# Patient Record
Sex: Female | Born: 1990
Health system: Southern US, Community
[De-identification: ages and names within clinical notes are randomized; demographics above are authoritative.]

## PROBLEM LIST (undated history)

## (undated) DIAGNOSIS — M329 Systemic lupus erythematosus, unspecified: Secondary | ICD-10-CM

## (undated) DIAGNOSIS — D84821 Immunodeficiency due to drugs: Secondary | ICD-10-CM

## (undated) DIAGNOSIS — R0609 Other forms of dyspnea: Secondary | ICD-10-CM

## (undated) DIAGNOSIS — J302 Other seasonal allergic rhinitis: Secondary | ICD-10-CM

## (undated) DIAGNOSIS — F32A Depression, unspecified: Secondary | ICD-10-CM

## (undated) DIAGNOSIS — I493 Ventricular premature depolarization: Secondary | ICD-10-CM

## (undated) DIAGNOSIS — F329 Major depressive disorder, single episode, unspecified: Secondary | ICD-10-CM

## (undated) HISTORY — DX: Immunodeficiency due to drugs: D84.821

## (undated) HISTORY — DX: Systemic lupus erythematosus, unspecified: M32.9

## (undated) HISTORY — DX: Major depressive disorder, single episode, unspecified: F32.9

## (undated) HISTORY — PX: OTHER SURGICAL HISTORY: SHX169

## (undated) HISTORY — DX: Ventricular premature depolarization: I49.3

## (undated) HISTORY — DX: Other forms of dyspnea: R06.09

## (undated) HISTORY — DX: Depression, unspecified: F32.A

## (undated) HISTORY — DX: Other seasonal allergic rhinitis: J30.2

---
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
## 2011-04-30 ENCOUNTER — Other Ambulatory Visit (HOSPITAL_COMMUNITY): Payer: Self-pay | Admitting: Family Medicine

## 2011-04-30 ENCOUNTER — Ambulatory Visit (HOSPITAL_COMMUNITY)
Admission: RE | Admit: 2011-04-30 | Discharge: 2011-04-30 | Disposition: A | Discharge status: Home / Self Care | Payer: BC Managed Care – PPO | Source: Ambulatory Visit | Attending: Family Medicine | Admitting: Family Medicine

## 2011-04-30 DIAGNOSIS — M25579 Pain in unspecified ankle and joints of unspecified foot: Secondary | ICD-10-CM

## 2011-04-30 DIAGNOSIS — S91309A Unspecified open wound, unspecified foot, initial encounter: Secondary | ICD-10-CM | POA: Insufficient documentation

## 2011-04-30 DIAGNOSIS — W268XXA Contact with other sharp object(s), not elsewhere classified, initial encounter: Secondary | ICD-10-CM | POA: Insufficient documentation

## 2011-08-06 LAB — RAPID URINE DRUG SCREEN, HOSP PERFORMED
Barbiturates: NOT DETECTED
Benzodiazepines: NOT DETECTED
Cocaine: NOT DETECTED
Tetrahydrocannabinol: NOT DETECTED

## 2011-08-06 LAB — URINE MICROSCOPIC-ADD ON

## 2011-08-06 LAB — POCT I-STAT CREATININE: Operator id: 196461

## 2011-08-06 LAB — I-STAT 8, (EC8 V) (CONVERTED LAB)
Acid-base deficit: 2
BUN: 12
Bicarbonate: 22.1
Chloride: 108
Glucose, Bld: 89
HCT: 42
Potassium: 3.9
TCO2: 23

## 2011-08-06 LAB — CBC
MCHC: 33.8
MCV: 97.6 — ABNORMAL HIGH
RBC: 4.1
RDW: 12.3

## 2011-08-06 LAB — CARBOXYHEMOGLOBIN
Carboxyhemoglobin: 1.2
Methemoglobin: 1.3
Total hemoglobin: 13.5

## 2011-08-06 LAB — DIFFERENTIAL
Eosinophils Absolute: 0.2
Monocytes Absolute: 0.7
Monocytes Relative: 7
Neutro Abs: 7.9
Neutrophils Relative %: 78 — ABNORMAL HIGH

## 2011-08-06 LAB — URINALYSIS, ROUTINE W REFLEX MICROSCOPIC
Bilirubin Urine: NEGATIVE
Nitrite: NEGATIVE
Protein, ur: 30 — AB
Urobilinogen, UA: 0.2

## 2014-08-01 ENCOUNTER — Institutional Professional Consult (permissible substitution): Payer: BC Managed Care – PPO | Admitting: Internal Medicine

## 2015-04-11 ENCOUNTER — Other Ambulatory Visit (HOSPITAL_COMMUNITY)
Admission: RE | Admit: 2015-04-11 | Discharge: 2015-04-11 | Disposition: A | Discharge status: Home / Self Care | Payer: Self-pay | Source: Ambulatory Visit | Attending: Gynecology | Admitting: Gynecology

## 2015-04-11 ENCOUNTER — Ambulatory Visit (INDEPENDENT_AMBULATORY_CARE_PROVIDER_SITE_OTHER): Payer: BLUE CROSS/BLUE SHIELD | Admitting: Women's Health

## 2015-04-11 ENCOUNTER — Encounter: Payer: Self-pay | Admitting: Women's Health

## 2015-04-11 VITALS — BP 112/66 | Ht 66.0 in | Wt 147.0 lb

## 2015-04-11 DIAGNOSIS — Z01419 Encounter for gynecological examination (general) (routine) without abnormal findings: Secondary | ICD-10-CM | POA: Diagnosis not present

## 2015-04-11 DIAGNOSIS — N3000 Acute cystitis without hematuria: Secondary | ICD-10-CM

## 2015-04-11 DIAGNOSIS — Z113 Encounter for screening for infections with a predominantly sexual mode of transmission: Secondary | ICD-10-CM | POA: Diagnosis not present

## 2015-04-11 DIAGNOSIS — Z3041 Encounter for surveillance of contraceptive pills: Secondary | ICD-10-CM | POA: Diagnosis not present

## 2015-04-11 LAB — CBC WITH DIFFERENTIAL/PLATELET
BASOS ABS: 0 10*3/uL (ref 0.0–0.1)
BASOS PCT: 0 % (ref 0–1)
Eosinophils Absolute: 0 10*3/uL (ref 0.0–0.7)
Eosinophils Relative: 0 % (ref 0–5)
HCT: 38.4 % (ref 36.0–46.0)
Hemoglobin: 12.7 g/dL (ref 12.0–15.0)
Lymphocytes Relative: 19 % (ref 12–46)
Lymphs Abs: 1.1 10*3/uL (ref 0.7–4.0)
MCH: 31 pg (ref 26.0–34.0)
MCHC: 33.1 g/dL (ref 30.0–36.0)
MCV: 93.7 fL (ref 78.0–100.0)
MONOS PCT: 10 % (ref 3–12)
MPV: 9.1 fL (ref 8.6–12.4)
Monocytes Absolute: 0.6 10*3/uL (ref 0.1–1.0)
NEUTROS ABS: 4.1 10*3/uL (ref 1.7–7.7)
NEUTROS PCT: 71 % (ref 43–77)
PLATELETS: 154 10*3/uL (ref 150–400)
RBC: 4.1 MIL/uL (ref 3.87–5.11)
RDW: 13.6 % (ref 11.5–15.5)
WBC: 5.8 10*3/uL (ref 4.0–10.5)

## 2015-04-11 LAB — HEPATITIS B SURFACE ANTIGEN: Hepatitis B Surface Ag: NEGATIVE

## 2015-04-11 LAB — URINALYSIS W MICROSCOPIC + REFLEX CULTURE
BILIRUBIN URINE: NEGATIVE
CASTS: NONE SEEN
Crystals: NONE SEEN
GLUCOSE, UA: 100 mg/dL — AB
Ketones, ur: NEGATIVE mg/dL
Nitrite: POSITIVE — AB
PH: 6 (ref 5.0–8.0)
PROTEIN: NEGATIVE mg/dL
Specific Gravity, Urine: <1.005 — ABNORMAL LOW (ref 1.005–1.030)
Urobilinogen, UA: 1 mg/dL (ref 0.0–1.0)

## 2015-04-11 LAB — TSH: TSH: 1.613 u[IU]/mL (ref 0.350–4.500)

## 2015-04-11 LAB — HEPATITIS C ANTIBODY: HCV AB: NEGATIVE

## 2015-04-11 LAB — RPR

## 2015-04-11 MED ORDER — SULFAMETHOXAZOLE-TRIMETHOPRIM 800-160 MG PO TABS: 1.0000 | ORAL_TABLET | Freq: Two times a day (BID) | ORAL | Status: DC

## 2015-04-11 MED ORDER — NORGESTIM-ETH ESTRAD TRIPHASIC 0.18/0.215/0.25 MG-35 MCG PO TABS: 1.0000 | ORAL_TABLET | Freq: Every day | ORAL | Status: DC

## 2015-04-11 NOTE — Progress Notes (Signed)
Leslie Mccoy [birth year] [id number]    History:    Presents for annual exam.  Regular light monthly cycle on Ortho Tri-Cyclen originally prescribed by primary care. Has never had pelvic exam. Also having increased urinary frequency with urgency. Maternal aunt breast cancer. Same partner. Gardasil series completed in high school.  Past medical history, past surgical history, family history and social history were all reviewed and documented in the EPIC chart. 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 Parents healthy.  ROS:  A ROS was performed and pertinent positives and negatives are included.  Exam:  Filed Vitals:   04/11/15 1008  BP: 112/66    General appearance:  Normal Thyroid:  Symmetrical, normal in size, without palpable masses or nodularity. Respiratory  Auscultation:  Clear without wheezing or rhonchi Cardiovascular  Auscultation:  Regular rate, without rubs, murmurs or gallops  Edema/varicosities:  Not grossly evident Abdominal  Soft,nontender, without masses, guarding or rebound.  Liver/spleen:  No organomegaly noted  Hernia:  None appreciated  Skin  Inspection:  Grossly normal   Breasts: Examined lying and sitting.     Right: Without masses, retractions, discharge or axillary adenopathy.     Left: Without masses, retractions, discharge or axillary adenopathy. Gentitourinary   Inguinal/mons:  Normal without inguinal adenopathy  External genitalia:  Normal  BUS/Urethra/Skene's glands:  Normal  Vagina:  Normal  Cervix:  Normal  Uterus:   normal in size, shape and contour.  Midline and mobile  Adnexa/parametria:     Rt: Without masses or tenderness.   Lt: Without masses or tenderness.  Anus and perineum: Normal   UA: Moderate blood,  small leukocytes,  TNTC WBCs, many bacteria. Assessment/Plan:  24 y.o. S W[employer] G0  for annual exam and with UTI symptoms.    UTI Contraception management STD screen Anxiety/depression  managed by primary care  Plan: Septra twice daily for 3 days #6. Instructed to call if no relief of symptoms. UTI prevention discussed. Ortho Tri-Cyclen prescription, proper use, slight risk for blood clots and strokes reviewed, continue condoms until permanent partner. SBE's, exercise, calcium rich diet, MVI daily encouraged. Safety reviewed. CBC, UA, Pap, GC/Chlamydia, HIV, hep B, C, RPR. Urine culture pending.   Harrington Challenger WHNP, 11:18 AM 04/11/2015

## 2015-04-11 NOTE — Patient Instructions (Signed)

## 2015-04-12 LAB — HIV ANTIBODY (ROUTINE TESTING W REFLEX): HIV 1&2 Ab, 4th Generation: NONREACTIVE

## 2015-04-12 LAB — GC/CHLAMYDIA PROBE AMP
CT Probe RNA: NEGATIVE
GC Probe RNA: NEGATIVE

## 2015-04-13 ENCOUNTER — Ambulatory Visit: Payer: Self-pay | Admitting: Women's Health

## 2015-04-13 LAB — URINE CULTURE
COLONY COUNT: NO GROWTH
Organism ID, Bacteria: NO GROWTH

## 2015-04-13 LAB — CYTOLOGY - PAP

## 2015-04-14 ENCOUNTER — Telehealth: Payer: Self-pay | Admitting: *Deleted

## 2015-04-14 NOTE — Telephone Encounter (Signed)
Please call and review urine culture was negative, best to take over-the-counter Azo, cranberry supplement, and increase plain water. Call if symptoms persist.

## 2015-04-14 NOTE — Telephone Encounter (Signed)
Pt informed with the below note. 

## 2015-04-14 NOTE — Telephone Encounter (Signed)
Pt was prescribed Septra twice daily for 3 days #6 on 04/11/15. Took last pill last night and woke up this am with urgency still. Please advise

## 2015-04-20 ENCOUNTER — Telehealth: Payer: Self-pay | Admitting: *Deleted

## 2015-04-20 NOTE — Telephone Encounter (Signed)
Pt informed with negative STD screening results.

## 2015-04-21 ENCOUNTER — Telehealth: Payer: Self-pay | Admitting: *Deleted

## 2015-04-21 NOTE — Telephone Encounter (Signed)
Pt will continue pills, taking new pack of pills already, in active. Will follow up if needed

## 2015-04-21 NOTE — Telephone Encounter (Signed)
Pt called c/o bleeding in first pack of birth control pills, changing pads every 5 hours, started new pack this past Sunday. Pt said she has been bleeding now for 1 week. Recommendations? Please advise

## 2015-04-21 NOTE — Telephone Encounter (Signed)
Please call, instruct to start new pack today, review common to have some irregular bleeding first months on pills. Call if continued problems with cycle.

## 2015-11-21 ENCOUNTER — Telehealth: Payer: Self-pay | Admitting: *Deleted

## 2015-11-21 NOTE — Telephone Encounter (Signed)
Pt called and left message in triage c/o headaches x 1 month, I called pt back and asked her to return my call.

## 2016-01-10 ENCOUNTER — Ambulatory Visit (INDEPENDENT_AMBULATORY_CARE_PROVIDER_SITE_OTHER): Payer: BLUE CROSS/BLUE SHIELD | Admitting: Women's Health

## 2016-01-10 ENCOUNTER — Telehealth: Payer: Self-pay | Admitting: *Deleted

## 2016-01-10 ENCOUNTER — Encounter: Payer: Self-pay | Admitting: Women's Health

## 2016-01-10 VITALS — BP 112/72

## 2016-01-10 DIAGNOSIS — M069 Rheumatoid arthritis, unspecified: Secondary | ICD-10-CM | POA: Diagnosis not present

## 2016-01-10 LAB — RHEUMATOID FACTOR: Rhuematoid fact SerPl-aCnc: <10 IU/mL (ref <=14)

## 2016-01-10 MED ORDER — IBUPROFEN 600 MG PO TABS: 600.0000 mg | ORAL_TABLET | Freq: Three times a day (TID) | ORAL | Status: DC | PRN

## 2016-01-10 NOTE — Telephone Encounter (Signed)
Per nancy schedule appointment for pt to see rheumatology for joint pain, referral faxed to [facility] rheumatology they will contact pt to schedule and call me with time and date. I informed pt with this as well.

## 2016-01-10 NOTE — Progress Notes (Signed)
Presents with 2-3 weeks of joint stiffness, swelling, and reduced functionality in the joints of her right hand. She reports symptoms are worse when waking up, often taking up to 4 hours to fully resolve. Interfering with ADLs. No swelling/stiffness of left hand. Stiffness to right shoulder, left hip and right elbow as well. Denies rashes, fever, fatigue, change in routine, injury, tic bites or viruses. Monthly cycle on Ortho Tri-Cyclen/same partner.   Exam: Appears well. Mild edema visible over the second through fourth metacarpals of the right hand, no erythema or warmth, with decreased range of motion. Left hand normal with full range of motion. 3/5 strength to right hand, 5/5 strength to left hand. Right elbow pain with extension. Right shoulder pain with external rotation. Full range of motion to left elbow and left shoulder without pain.  Joint stiffness/swelling  Plan: Rx for Motrin 600 mg q8 hours for pain given, advised using alternating heating and icing over affected joints. ESR, rheumatoid factor, anti-CCP IgG. Referral to rheumatology.

## 2016-01-10 NOTE — Patient Instructions (Signed)
Rheumatoid Arthritis  Rheumatoid arthritis is a long-term (chronic) inflammatory disease that causes pain, swelling, and stiffness of the joints. It can affect the entire body, including the eyes and lungs. The effects of rheumatoid arthritis vary widely among those with the condition.  CAUSES  The cause of rheumatoid arthritis is not known. It tends to run in families and is more common in women. Certain cells of the body's natural defense system (immune system) do not work properly and begin to attack healthy joints. It primarily involves the connective tissue that lines the joints (synovial membrane). This can cause damage to the joint.  SYMPTOMS  · Pain, stiffness, swelling, and decreased motion of many joints, especially in the hands and feet.  · Stiffness that is worse in the morning. It may last 1-2 hours or longer.  · Numbness and tingling in the hands.  · Fatigue.  · Loss of appetite.  · Weight loss.  · Low-grade fever.  · Dry eyes and mouth.  · Firm lumps (rheumatoid nodules) that grow beneath the skin in areas such as the elbows and hands.  DIAGNOSIS  Diagnosis is based on the symptoms described, an exam, and blood tests. Sometimes, X-rays are helpful.  TREATMENT  The goals of treatment are to relieve pain, reduce inflammation, and to slow down or stop joint damage and disability. Methods vary and may include:  · Maintaining a balance of rest, exercise, and proper nutrition.  · Your health care provider may adjust your medicines every 3 months until treatment goals are reached. Common medicines include:    Pain relievers (analgesics).    Corticosteroids and nonsteroidal anti-inflammatory drugs (NSAIDs) to reduce inflammation.    Disease-modifying antirheumatic drugs (DMARDs) to try to slow the course of the disease.    Biologic response modifiers to reduce inflammation and damage.  · Physical therapy and occupational therapy.  · Surgery for patients with severe joint damage. Joint replacement or fusing of  joints may be needed.  · Routine monitoring and ongoing care, such as office visits, blood and urine tests, and X-rays.  Your health care provider will work with you to identify the best treatment option for you, based on an assessment of the overall disease activity in your body.  HOME CARE INSTRUCTIONS  · Remain physically active and reduce activity when the disease gets worse.  · Eat a well-balanced diet.  · Put heat on affected joints when you wake up and before activities. Keep the heat on the affected joint for as long as directed by your health care provider.  · Put ice on affected joints following activities or exercising.    Put ice in a plastic bag.    Place a towel between your skin and the bag.    Leave the ice on for 15-20 minutes, 3-4 times per day, or as directed by your health care provider.  · Take medicines and supplements only as directed by your health care provider.  · Use splints as directed by your health care provider. Splints help maintain joint position and function.  · Do not sleep with pillows under your knees. This may lead to spasms.  · Participate in a self-management program to keep current with the latest treatment and coping skills.  SEEK IMMEDIATE MEDICAL CARE IF:  · You have fainting episodes.  · You have periods of extreme weakness.  · You rapidly develop a hot, painful joint that is more severe than usual joint aches.  · You have chills.  ·   You have a fever.  FOR MORE INFORMATION  · American College of Rheumatology: www.rheumatology.org  · Arthritis Foundation: www.arthritis.org     This information is not intended to replace advice given to you by your health care provider. Make sure you discuss any questions you have with your health care provider.     Document Released: 10/11/2000 Document Revised: 11/04/2014 Document Reviewed: 11/20/2011  Elsevier Interactive Patient Education ©2016 [employer]

## 2016-01-11 LAB — SEDIMENTATION RATE: SED RATE: 44 mm/h — AB (ref 0–20)

## 2016-01-11 LAB — CYCLIC CITRUL PEPTIDE ANTIBODY, IGG

## 2016-01-16 NOTE — Telephone Encounter (Signed)
Appointment on 01/24/16 @ 8:15am with Azucena Fallen, PAC/ Dr.Hawkins PA

## 2016-01-27 DIAGNOSIS — M329 Systemic lupus erythematosus, unspecified: Secondary | ICD-10-CM

## 2016-01-27 DIAGNOSIS — IMO0002 Reserved for concepts with insufficient information to code with codable children: Secondary | ICD-10-CM

## 2016-01-27 HISTORY — DX: Reserved for concepts with insufficient information to code with codable children: IMO0002

## 2016-01-27 HISTORY — DX: Systemic lupus erythematosus, unspecified: M32.9

## 2016-02-06 DIAGNOSIS — M2550 Pain in unspecified joint: Secondary | ICD-10-CM | POA: Diagnosis not present

## 2016-02-06 DIAGNOSIS — Z6822 Body mass index (BMI) 22.0-22.9, adult: Secondary | ICD-10-CM | POA: Diagnosis not present

## 2016-02-06 DIAGNOSIS — Z1389 Encounter for screening for other disorder: Secondary | ICD-10-CM | POA: Diagnosis not present

## 2016-02-06 DIAGNOSIS — Z0000 Encounter for general adult medical examination without abnormal findings: Secondary | ICD-10-CM | POA: Diagnosis not present

## 2016-02-07 DIAGNOSIS — M7989 Other specified soft tissue disorders: Secondary | ICD-10-CM | POA: Diagnosis not present

## 2016-02-07 DIAGNOSIS — M2550 Pain in unspecified joint: Secondary | ICD-10-CM | POA: Diagnosis not present

## 2016-02-07 DIAGNOSIS — M329 Systemic lupus erythematosus, unspecified: Secondary | ICD-10-CM | POA: Diagnosis not present

## 2016-02-07 DIAGNOSIS — R5383 Other fatigue: Secondary | ICD-10-CM | POA: Diagnosis not present

## 2016-02-20 ENCOUNTER — Encounter: Payer: Self-pay | Admitting: Women's Health

## 2016-02-20 DIAGNOSIS — M329 Systemic lupus erythematosus, unspecified: Secondary | ICD-10-CM | POA: Insufficient documentation

## 2016-02-20 DIAGNOSIS — IMO0002 Reserved for concepts with insufficient information to code with codable children: Secondary | ICD-10-CM | POA: Insufficient documentation

## 2016-03-05 DIAGNOSIS — F4323 Adjustment disorder with mixed anxiety and depressed mood: Secondary | ICD-10-CM | POA: Diagnosis not present

## 2016-03-14 DIAGNOSIS — M329 Systemic lupus erythematosus, unspecified: Secondary | ICD-10-CM | POA: Diagnosis not present

## 2016-03-14 DIAGNOSIS — R5383 Other fatigue: Secondary | ICD-10-CM | POA: Diagnosis not present

## 2016-03-14 DIAGNOSIS — M2550 Pain in unspecified joint: Secondary | ICD-10-CM | POA: Diagnosis not present

## 2016-03-14 DIAGNOSIS — Z79899 Other long term (current) drug therapy: Secondary | ICD-10-CM | POA: Diagnosis not present

## 2016-04-26 DIAGNOSIS — Z3041 Encounter for surveillance of contraceptive pills: Secondary | ICD-10-CM | POA: Diagnosis not present

## 2016-04-26 DIAGNOSIS — M3219 Other organ or system involvement in systemic lupus erythematosus: Secondary | ICD-10-CM | POA: Diagnosis not present

## 2016-05-04 ENCOUNTER — Other Ambulatory Visit: Payer: Self-pay | Admitting: Women's Health

## 2016-05-05 ENCOUNTER — Other Ambulatory Visit: Payer: Self-pay | Admitting: Women's Health

## 2016-05-06 ENCOUNTER — Other Ambulatory Visit: Payer: Self-pay | Admitting: Women's Health

## 2016-05-06 NOTE — Telephone Encounter (Signed)
Annual scheduled on 06/04/16 

## 2016-05-28 HISTORY — PX: INTRAUTERINE DEVICE INSERTION: SHX323

## 2016-05-29 ENCOUNTER — Encounter: Payer: Self-pay | Admitting: Women's Health

## 2016-05-29 ENCOUNTER — Telehealth: Payer: Self-pay | Admitting: Gynecology

## 2016-05-29 ENCOUNTER — Ambulatory Visit (INDEPENDENT_AMBULATORY_CARE_PROVIDER_SITE_OTHER): Payer: BLUE CROSS/BLUE SHIELD | Admitting: Women's Health

## 2016-05-29 VITALS — BP 115/76 | Ht 66.0 in | Wt 138.4 lb

## 2016-05-29 DIAGNOSIS — Z3041 Encounter for surveillance of contraceptive pills: Secondary | ICD-10-CM

## 2016-05-29 DIAGNOSIS — Z01419 Encounter for gynecological examination (general) (routine) without abnormal findings: Secondary | ICD-10-CM

## 2016-05-29 MED ORDER — NORGESTIM-ETH ESTRAD TRIPHASIC 0.18/0.215/0.25 MG-35 MCG PO TABS: 1.0000 | ORAL_TABLET | Freq: Every day | ORAL | 4 refills | Status: DC

## 2016-05-29 NOTE — Progress Notes (Signed)
Leslie Mccoy 12-01-1990 [id number]    History:    Presents for annual exam.  Monthly cycle on TriNessa. Diagnosed with lupus April 2017. Symptoms started out with arthritic type symptoms in her hands. Is seeing a doctor at [facility] in the lupus clinic, currently on methotrexate, Plaquenil and is tapering prednisone.. Gardasil series completed. Negative STD screen with current partner. Normal Pap history.  Past medical history, past surgical history, family history and social history were all reviewed and documented in the EPIC chart. Graduated from [employer] currently not working. Parents healthy.  ROS:  A ROS was performed and pertinent positives and negatives are included.  Exam:  Vitals:   05/29/16 1052  BP: 115/76     General appearance:  Normal Thyroid:  Symmetrical, normal in size, without palpable masses or nodularity. Respiratory  Auscultation:  Clear without wheezing or rhonchi Cardiovascular  Auscultation:  Regular rate, without rubs, murmurs or gallops  Edema/varicosities:  Not grossly evident Abdominal  Soft,nontender, without masses, guarding or rebound.  Liver/spleen:  No organomegaly noted  Hernia:  None appreciated  Skin  Inspection:  Grossly normal   Breasts: Examined lying and sitting.     Right: Without masses, retractions, discharge or axillary adenopathy.     Left: Without masses, retractions, discharge or axillary adenopathy. Gentitourinary   Inguinal/mons:  Normal without inguinal adenopathy  External genitalia:  Normal  BUS/Urethra/Skene's glands:  Normal  Vagina:  Normal  Cervix:  Normal  Uterus: normal in size, shape and contour.  Midline and mobile  Adnexa/parametria:     Rt: Without masses or tenderness.   Lt: Without masses or tenderness.  Anus and perineum: Normal    Assessment/Plan:  25 y.o. engaged [employer] G0 for annual exam.     Contraception management 01/2016-lupus diagnosed  Plan: Contraception options reviewed, currently on TriNessa, options  reviewed, Mirena IUD reviewed slight risk for infection, perforation and hemorrhage. Reviewed would be a safer option while on medications for lupus. Will check coverage have Dr. Audie Box place with next cycle. SBE's, exercise, yoga, importance of rest and exercise reviewed. UA, Pap normal 2016, new screening guidelines reviewed.    Harrington Challenger [facility], 11:32 AM 05/29/2016

## 2016-05-29 NOTE — Patient Instructions (Addendum)
Levonorgestrel intrauterine device (IUD) What is this medicine? LEVONORGESTREL IUD (LEE voe nor jes trel) is a contraceptive (birth control) device. The device is placed inside the uterus by a healthcare professional. It is used to prevent pregnancy and can also be used to treat heavy bleeding that occurs during your period. Depending on the device, it can be used for 3 to 5 years. This medicine may be used for other purposes; ask your health care provider or pharmacist if you have questions. What should I tell my health care provider before I take this medicine? They need to know if you have any of these conditions: -abnormal Pap smear -cancer of the breast, uterus, or cervix -diabetes -endometritis -genital or pelvic infection now or in the past -have more than one sexual partner or your partner has more than one partner -heart disease -history of an ectopic or tubal pregnancy -immune system problems -IUD in place -liver disease or tumor -problems with blood clots or take blood-thinners -use intravenous drugs -uterus of unusual shape -vaginal bleeding that has not been explained -an unusual or allergic reaction to levonorgestrel, other hormones, silicone, or polyethylene, medicines, foods, dyes, or preservatives -pregnant or trying to get pregnant -breast-feeding How should I use this medicine? This device is placed inside the uterus by a health care professional. Talk to your pediatrician regarding the use of this medicine in children. Special care may be needed. Overdosage: If you think you have taken too much of this medicine contact a poison control center or emergency room at once. NOTE: This medicine is only for you. Do not share this medicine with others. What if I miss a dose? This does not apply. What may interact with this medicine? Do not take this medicine with any of the following medications: -amprenavir -bosentan -fosamprenavir This medicine may also interact with  the following medications: -aprepitant -barbiturate medicines for inducing sleep or treating seizures -bexarotene -griseofulvin -medicines to treat seizures like carbamazepine, ethotoin, felbamate, oxcarbazepine, phenytoin, topiramate -modafinil -pioglitazone -rifabutin -rifampin -rifapentine -some medicines to treat HIV infection like atazanavir, indinavir, lopinavir, nelfinavir, tipranavir, ritonavir -St. John's wort -warfarin This list may not describe all possible interactions. Give your health care provider a list of all the medicines, herbs, non-prescription drugs, or dietary supplements you use. Also tell them if you smoke, drink alcohol, or use illegal drugs. Some items may interact with your medicine. What should I watch for while using this medicine? Visit your doctor or health care professional for regular check ups. See your doctor if you or your partner has sexual contact with others, becomes HIV positive, or gets a sexual transmitted disease. This product does not protect you against HIV infection (AIDS) or other sexually transmitted diseases. You can check the placement of the IUD yourself by reaching up to the top of your vagina with clean fingers to feel the threads. Do not pull on the threads. It is a good habit to check placement after each menstrual period. Call your doctor right away if you feel more of the IUD than just the threads or if you cannot feel the threads at all. The IUD may come out by itself. You may become pregnant if the device comes out. If you notice that the IUD has come out use a backup birth control method like condoms and call your health care provider. Using tampons will not change the position of the IUD and are okay to use during your period. What side effects may I notice from receiving this medicine?  Side effects that you should report to your doctor or health care professional as soon as possible: -allergic reactions like skin rash, itching or  hives, swelling of the face, lips, or tongue -fever, flu-like symptoms -genital sores -high blood pressure -no menstrual period for 6 weeks during use -pain, swelling, warmth in the leg -pelvic pain or tenderness -severe or sudden headache -signs of pregnancy -stomach cramping -sudden shortness of breath -trouble with balance, talking, or walking -unusual vaginal bleeding, discharge -yellowing of the eyes or skin Side effects that usually do not require medical attention (report to your doctor or health care professional if they continue or are bothersome): -acne -breast pain -change in sex drive or performance -changes in weight -cramping, dizziness, or faintness while the device is being inserted -headache -irregular menstrual bleeding within first 3 to 6 months of use -nausea This list may not describe all possible side effects. Call your doctor for medical advice about side effects. You may report side effects to FDA at 1-800-FDA-1088. Where should I keep my medicine? This does not apply. NOTE: This sheet is a summary. It may not cover all possible information. If you have questions about this medicine, talk to your doctor, pharmacist, or health care provider.    2016, Elsevier/Gold Standard. (2011-11-14 13:54:04) Health Maintenance, Female Adopting a healthy lifestyle and getting preventive care can go a long way to promote health and wellness. Talk with your health care provider about what schedule of regular examinations is right for you. This is a good chance for you to check in with your provider about disease prevention and staying healthy. In between checkups, there are plenty of things you can do on your own. Experts have done a lot of research about which lifestyle changes and preventive measures are most likely to keep you healthy. Ask your health care provider for more information. WEIGHT AND DIET  Eat a healthy diet  Be sure to include plenty of vegetables, fruits,  low-fat dairy products, and lean protein.  Do not eat a lot of foods high in solid fats, added sugars, or salt.  Get regular exercise. This is one of the most important things you can do for your health.  Most adults should exercise for at least 150 minutes each week. The exercise should increase your heart rate and make you sweat (moderate-intensity exercise).  Most adults should also do strengthening exercises at least twice a week. This is in addition to the moderate-intensity exercise.  Maintain a healthy weight  Body mass index (BMI) is a measurement that can be used to identify possible weight problems. It estimates body fat based on height and weight. Your health care provider can help determine your BMI and help you achieve or maintain a healthy weight.  For females 30 years of age and older:   A BMI below 18.5 is considered underweight.  A BMI of 18.5 to 24.9 is normal.  A BMI of 25 to 29.9 is considered overweight.  A BMI of 30 and above is considered obese.  Watch levels of cholesterol and blood lipids  You should start having your blood tested for lipids and cholesterol at 24 years of age, then have this test every 5 years.  You may need to have your cholesterol levels checked more often if:  Your lipid or cholesterol levels are high.  You are older than 24 years of age.  You are at high risk for heart disease.  CANCER SCREENING   Lung Cancer  Lung cancer  screening is recommended for adults 58-66 years old who are at high risk for lung cancer because of a history of smoking.  A yearly low-dose CT scan of the lungs is recommended for people who:  Currently smoke.  Have quit within the past 15 years.  Have at least a 30-pack-year history of smoking. A pack year is smoking an average of one pack of cigarettes a day for 1 year.  Yearly screening should continue until it has been 15 years since you quit.  Yearly screening should stop if you develop a  health problem that would prevent you from having lung cancer treatment.  Breast Cancer  Practice breast self-awareness. This means understanding how your breasts normally appear and feel.  It also means doing regular breast self-exams. Let your health care provider know about any changes, no matter how small.  If you are in your 20s or 30s, you should have a clinical breast exam (CBE) by a health care provider every 1-3 years as part of a regular health exam.  If you are 16 or older, have a CBE every year. Also consider having a breast X-ray (mammogram) every year.  If you have a family history of breast cancer, talk to your health care provider about genetic screening.  If you are at high risk for breast cancer, talk to your health care provider about having an MRI and a mammogram every year.  Breast cancer gene (BRCA) assessment is recommended for women who have family members with BRCA-related cancers. BRCA-related cancers include:  Breast.  Ovarian.  Tubal.  Peritoneal cancers.  Results of the assessment will determine the need for genetic counseling and BRCA1 and BRCA2 testing. Cervical Cancer Your health care provider may recommend that you be screened regularly for cancer of the pelvic organs (ovaries, uterus, and vagina). This screening involves a pelvic examination, including checking for microscopic changes to the surface of your cervix (Pap test). You may be encouraged to have this screening done every 3 years, beginning at age 9.  For women ages 50-65, health care providers may recommend pelvic exams and Pap testing every 3 years, or they may recommend the Pap and pelvic exam, combined with testing for human papilloma virus (HPV), every 5 years. Some types of HPV increase your risk of cervical cancer. Testing for HPV may also be done on women of any age with unclear Pap test results.  Other health care providers may not recommend any screening for nonpregnant women who  are considered low risk for pelvic cancer and who do not have symptoms. Ask your health care provider if a screening pelvic exam is right for you.  If you have had past treatment for cervical cancer or a condition that could lead to cancer, you need Pap tests and screening for cancer for at least 20 years after your treatment. If Pap tests have been discontinued, your risk factors (such as having a new sexual partner) need to be reassessed to determine if screening should resume. Some women have medical problems that increase the chance of getting cervical cancer. In these cases, your health care provider may recommend more frequent screening and Pap tests. Colorectal Cancer  This type of cancer can be detected and often prevented.  Routine colorectal cancer screening usually begins at 24 years of age and continues through 24 years of age.  Your health care provider may recommend screening at an earlier age if you have risk factors for colon cancer.  Your health care provider may  also recommend using home test kits to check for hidden blood in the stool.  A small camera at the end of a tube can be used to examine your colon directly (sigmoidoscopy or colonoscopy). This is done to check for the earliest forms of colorectal cancer.  Routine screening usually begins at age 30.  Direct examination of the colon should be repeated every 5-10 years through 24 years of age. However, you may need to be screened more often if early forms of precancerous polyps or small growths are found. Skin Cancer  Check your skin from head to toe regularly.  Tell your health care provider about any new moles or changes in moles, especially if there is a change in a mole's shape or color.  Also tell your health care provider if you have a mole that is larger than the size of a pencil eraser.  Always use sunscreen. Apply sunscreen liberally and repeatedly throughout the day.  Protect yourself by wearing long  sleeves, pants, a wide-brimmed hat, and sunglasses whenever you are outside. HEART DISEASE, DIABETES, AND HIGH BLOOD PRESSURE   High blood pressure causes heart disease and increases the risk of stroke. High blood pressure is more likely to develop in:  People who have blood pressure in the high end of the normal range (130-139/85-89 mm Hg).  People who are overweight or obese.  People who are African American.  If you are 42-67 years of age, have your blood pressure checked every 3-5 years. If you are 37 years of age or older, have your blood pressure checked every year. You should have your blood pressure measured twice--once when you are at a hospital or clinic, and once when you are not at a hospital or clinic. Record the average of the two measurements. To check your blood pressure when you are not at a hospital or clinic, you can use:  An automated blood pressure machine at a pharmacy.  A home blood pressure monitor.  If you are between 20 years and 21 years old, ask your health care provider if you should take aspirin to prevent strokes.  Have regular diabetes screenings. This involves taking a blood sample to check your fasting blood sugar level.  If you are at a normal weight and have a low risk for diabetes, have this test once every three years after 24 years of age.  If you are overweight and have a high risk for diabetes, consider being tested at a younger age or more often. PREVENTING INFECTION  Hepatitis B  If you have a higher risk for hepatitis B, you should be screened for this virus. You are considered at high risk for hepatitis B if:  You were born in a country where hepatitis B is common. Ask your health care provider which countries are considered high risk.  Your parents were born in a high-risk country, and you have not been immunized against hepatitis B (hepatitis B vaccine).  You have HIV or AIDS.  You use needles to inject street drugs.  You live with  someone who has hepatitis B.  You have had sex with someone who has hepatitis B.  You get hemodialysis treatment.  You take certain medicines for conditions, including cancer, organ transplantation, and autoimmune conditions. Hepatitis C  Blood testing is recommended for:  Everyone born from [birth year] through 1964.  Anyone with known risk factors for hepatitis C. Sexually transmitted infections (STIs)  You should be screened for sexually transmitted infections (STIs) including gonorrhea  and chlamydia if:  You are sexually active and are younger than 24 years of age.  You are older than 24 years of age and your health care provider tells you that you are at risk for this type of infection.  Your sexual activity has changed since you were last screened and you are at an increased risk for chlamydia or gonorrhea. Ask your health care provider if you are at risk.  If you do not have HIV, but are at risk, it may be recommended that you take a prescription medicine daily to prevent HIV infection. This is called pre-exposure prophylaxis (PrEP). You are considered at risk if:  You are sexually active and do not regularly use condoms or know the HIV status of your partner(s).  You take drugs by injection.  You are sexually active with a partner who has HIV. Talk with your health care provider about whether you are at high risk of being infected with HIV. If you choose to begin PrEP, you should first be tested for HIV. You should then be tested every 3 months for as long as you are taking PrEP.  PREGNANCY   If you are premenopausal and you may become pregnant, ask your health care provider about preconception counseling.  If you may become pregnant, take 400 to 800 micrograms (mcg) of folic acid every day.  If you want to prevent pregnancy, talk to your health care provider about birth control (contraception). OSTEOPOROSIS AND MENOPAUSE   Osteoporosis is a disease in which the bones lose  minerals and strength with aging. This can result in serious bone fractures. Your risk for osteoporosis can be identified using a bone density scan.  If you are 42 years of age or older, or if you are at risk for osteoporosis and fractures, ask your health care provider if you should be screened.  Ask your health care provider whether you should take a calcium or vitamin D supplement to lower your risk for osteoporosis.  Menopause may have certain physical symptoms and risks.  Hormone replacement therapy may reduce some of these symptoms and risks. Talk to your health care provider about whether hormone replacement therapy is right for you.  HOME CARE INSTRUCTIONS   Schedule regular health, dental, and eye exams.  Stay current with your immunizations.   Do not use any tobacco products including cigarettes, chewing tobacco, or electronic cigarettes.  If you are pregnant, do not drink alcohol.  If you are breastfeeding, limit how much and how often you drink alcohol.  Limit alcohol intake to no more than 1 drink per day for nonpregnant women. One drink equals 12 ounces of beer, 5 ounces of wine, or 1 ounces of hard liquor.  Do not use street drugs.  Do not share needles.  Ask your health care provider for help if you need support or information about quitting drugs.  Tell your health care provider if you often feel depressed.  Tell your health care provider if you have ever been abused or do not feel safe at home.   This information is not intended to replace advice given to you by your health care provider. Make sure you discuss any questions you have with your health care provider.   Document Released: 04/29/2011 Document Revised: 11/04/2014 Document Reviewed: 09/15/2013 Elsevier Interactive Patient Education [employer].

## 2016-05-29 NOTE — Telephone Encounter (Signed)
05/29/16-Pt was advised today that her [facility] ins will cover the Mirena for contraception with her $50 copay. Appt already made with TF for next week. BC Ref#[id number]

## 2016-06-03 ENCOUNTER — Encounter: Payer: Self-pay | Admitting: Gynecology

## 2016-06-03 ENCOUNTER — Ambulatory Visit (INDEPENDENT_AMBULATORY_CARE_PROVIDER_SITE_OTHER): Payer: BLUE CROSS/BLUE SHIELD | Admitting: Gynecology

## 2016-06-03 VITALS — BP 118/74 | HR 84

## 2016-06-03 DIAGNOSIS — Z30430 Encounter for insertion of intrauterine contraceptive device: Secondary | ICD-10-CM | POA: Diagnosis not present

## 2016-06-03 NOTE — Patient Instructions (Signed)
Intrauterine Device Insertion Most often, an intrauterine device (IUD) is inserted into the uterus to prevent pregnancy. There are 2 types of IUDs available:  Copper IUD--This type of IUD creates an environment that is not favorable to sperm survival. The mechanism of action of the copper IUD is not known for certain. It can stay in place for 10 years.  Hormone IUD--This type of IUD contains the hormone progestin (synthetic progesterone). The progestin thickens the cervical mucus and prevents sperm from entering the uterus, and it also thins the uterine lining. There is no evidence that the hormone IUD prevents implantation. One hormone IUD can stay in place for up to 5 years, and a different hormone IUD can stay in place for up to 3 years. An IUD is the most cost-effective birth control if left in place for the full duration. It may be removed at any time. LET YOUR HEALTH CARE PROVIDER KNOW ABOUT:  Any allergies you have.  All medicines you are taking, including vitamins, herbs, eye drops, creams, and over-the-counter medicines.  Previous problems you or members of your family have had with the use of anesthetics.  Any blood disorders you have.  Previous surgeries you have had.  Possibility of pregnancy.  Medical conditions you have. RISKS AND COMPLICATIONS  Generally, intrauterine device insertion is a safe procedure. However, as with any procedure, complications can occur. Possible complications include:  Accidental puncture (perforation) of the uterus.  Accidental placement of the IUD either in the muscle layer of the uterus (myometrium) or outside the uterus. If this happens, the IUD can be found essentially floating around the bowels and must be taken out surgically.  The IUD may fall out of the uterus (expulsion). This is more common in women who have recently had a child.   Pregnancy in the fallopian tube (ectopic).  Pelvic inflammatory disease (PID), which is infection of  the uterus and fallopian tubes. The risk of PID is slightly increased in the first 20 days after the IUD is placed, but the overall risk is still very low. BEFORE THE PROCEDURE  Schedule the IUD insertion for when you will have your menstrual period or right after, to make sure you are not pregnant. Placement of the IUD is better tolerated shortly after a menstrual cycle.  You may need to take tests or be examined to make sure you are not pregnant.  You may be required to take a pregnancy test.  You may be required to get checked for sexually transmitted infections (STIs) prior to placement. Placing an IUD in someone who has an infection can make the infection worse.  You may be given a pain reliever to take 1 or 2 hours before the procedure.  An exam will be performed to determine the size and position of your uterus.  Ask your health care provider about changing or stopping your regular medicines. PROCEDURE   A tool (speculum) is placed in the vagina. This allows your health care provider to see the lower part of the uterus (cervix).  The cervix is prepped with a medicine that lowers the risk of infection.  You may be given a medicine to numb each side of the cervix (intracervical or paracervical block). This is used to block and control any discomfort with insertion.  A tool (uterine sound) is inserted into the uterus to determine the length of the uterine cavity and the direction the uterus may be tilted.  A slim instrument (IUD inserter) is inserted through the cervical   canal and into your uterus.  The IUD is placed in the uterine cavity and the insertion device is removed.  The nylon string that is attached to the IUD and used for eventual IUD removal is trimmed. It is trimmed so that it lays high in the vagina, just outside the cervix. AFTER THE PROCEDURE  You may have bleeding after the procedure. This is normal. It varies from light spotting for a few days to menstrual-like  bleeding.  You may have mild cramping.   This information is not intended to replace advice given to you by your health care provider. Make sure you discuss any questions you have with your health care provider.   Document Released: 06/12/2011 Document Revised: 08/04/2013 Document Reviewed: 04/04/2013 Elsevier Interactive Patient Education 2016 Elsevier Inc.  

## 2016-06-03 NOTE — Progress Notes (Signed)
    [name] [birth year] [id number]        25 y.o.  [id number]  presents for Mirena IUD placement. She has read through the booklet, has no contraindications and signed the consent form. She currently is on a normal menses.  She is switching from oral contraceptives to the IUD due to recent diagnosis of lupus and the desire to decrease the thrombosis risk. I reviewed with her that it is progesterone based and that at least erratically she may absorb some progesterone and it may affect her from versus risk although certainly a lot less than birth control pills. The patient understands this and accepts this. I also reviewed the insertional process with her as well as the risks to include infection, either immediate or long-term, uterine perforation or migration requiring surgery to remove, other complications such as pain, hormonal side effects, infertility and possibility of failure with subsequent pregnancy.   Exam with Kennon Portela assistant Vitals:   06/03/16 1436  BP: 120/74    Pelvic: External BUS vagina normal. Cervix normal with menses flow. Uterus anteverted normal size shape contour midline mobile nontender. Adnexa without masses or tenderness.  Procedure: The cervix was cleansed with Betadine, anterior lip grasped with a single-tooth tenaculum, the uterus was sounded and a Mirena IUD was placed according to manufacturer's recommendations without difficulty. The strings were trimmed. The patient tolerated well and will follow up in one month for a postinsertional check.  Lot number:  [id number]    Dara Lords MD, 2:59 PM 06/03/2016

## 2016-06-06 ENCOUNTER — Ambulatory Visit: Payer: BLUE CROSS/BLUE SHIELD | Admitting: Gynecology

## 2016-06-07 DIAGNOSIS — D6862 Lupus anticoagulant syndrome: Secondary | ICD-10-CM | POA: Diagnosis not present

## 2016-06-07 DIAGNOSIS — M1990 Unspecified osteoarthritis, unspecified site: Secondary | ICD-10-CM | POA: Diagnosis not present

## 2016-06-07 DIAGNOSIS — M329 Systemic lupus erythematosus, unspecified: Secondary | ICD-10-CM | POA: Diagnosis not present

## 2016-06-07 DIAGNOSIS — M064 Inflammatory polyarthropathy: Secondary | ICD-10-CM | POA: Diagnosis not present

## 2016-06-07 DIAGNOSIS — F329 Major depressive disorder, single episode, unspecified: Secondary | ICD-10-CM | POA: Diagnosis not present

## 2016-06-07 DIAGNOSIS — R110 Nausea: Secondary | ICD-10-CM | POA: Diagnosis not present

## 2016-06-07 DIAGNOSIS — F339 Major depressive disorder, recurrent, unspecified: Secondary | ICD-10-CM | POA: Diagnosis not present

## 2016-06-07 DIAGNOSIS — Z23 Encounter for immunization: Secondary | ICD-10-CM | POA: Diagnosis not present

## 2016-06-07 DIAGNOSIS — Z79899 Other long term (current) drug therapy: Secondary | ICD-10-CM | POA: Diagnosis not present

## 2016-07-04 ENCOUNTER — Ambulatory Visit (INDEPENDENT_AMBULATORY_CARE_PROVIDER_SITE_OTHER): Payer: BLUE CROSS/BLUE SHIELD | Admitting: Gynecology

## 2016-07-04 VITALS — BP 120/76

## 2016-07-04 DIAGNOSIS — N938 Other specified abnormal uterine and vaginal bleeding: Secondary | ICD-10-CM | POA: Diagnosis not present

## 2016-07-04 DIAGNOSIS — Z30431 Encounter for routine checking of intrauterine contraceptive device: Secondary | ICD-10-CM | POA: Diagnosis not present

## 2016-07-04 NOTE — Patient Instructions (Signed)
Follow up in one year for annual exam when due. Sooner if any issues.

## 2016-07-04 NOTE — Progress Notes (Signed)
    Leslie Mccoy May 26, 1991 [id number]        25 y.o.  G0P0000 presents for IUD check. Had Mirena IUD placed 06/03/2016. Did well afterwards with no bleeding for about 2 weeks that started what she thought was her menses but has persisted despite on and off since then for the last 2 weeks. Seems to be slowly getting better. No pain cramping or other symptoms.  Past medical history,surgical history, problem list, medications, allergies, family history and social history were all reviewed and documented in the EPIC chart.  Directed ROS with pertinent positives and negatives documented in the history of present illness/assessment and plan.  Exam: Kennon Portela assistant Vitals:   07/04/16 1402  BP: 120/76   General appearance:  Normal Abdomen soft nontender without masses guarding rebound Pelvic external BUS vagina normal with slight staining. Cervix normal with IUD string visualized in appropriate length. Uterus normal size midline mobile nontender. Adnexa without masses or tenderness.  Assessment/Plan:  25 y.o. G0P0000 for IUD check with some dysfunctional bleeding. Reassured patient not unusual the first month or so after placement to have some irregular bleeding. Discussed options to include observation for now versus short course of progesterone such as Megace 20 mg daily 7 days. Patient prefers just to monitor at present. Will call if she continues to have spotting over the next week or so and will go with the progesterone. Assuming the bleeding resolves then she will follow up in one year for annual exam, sooner if any issues.  Greater than 50% of my time was spent in direct face to face counseling and coordination of care with the patient.     Dara Lords MD, 2:19 PM 07/04/2016

## 2016-07-12 DIAGNOSIS — L308 Other specified dermatitis: Secondary | ICD-10-CM | POA: Diagnosis not present

## 2016-07-12 DIAGNOSIS — D2261 Melanocytic nevi of right upper limb, including shoulder: Secondary | ICD-10-CM | POA: Diagnosis not present

## 2016-07-12 DIAGNOSIS — L920 Granuloma annulare: Secondary | ICD-10-CM | POA: Diagnosis not present

## 2016-07-12 DIAGNOSIS — D225 Melanocytic nevi of trunk: Secondary | ICD-10-CM | POA: Diagnosis not present

## 2016-07-27 ENCOUNTER — Telehealth: Payer: Self-pay | Admitting: Gynecology

## 2016-07-27 NOTE — Telephone Encounter (Signed)
On Call:  Started having lower abdominal cramping and nausea this AM.  Was afraid the IUD was moving. Placed 2 months ago without issues until now.  No bleeding, fever, chills, diarrhea.  Recommended motrin now with observation.  Possible viral GI.  Call back this weekend if increasing pain or bleeding.  If better, recommended OV next week for exam and IUD check

## 2016-08-01 ENCOUNTER — Ambulatory Visit (INDEPENDENT_AMBULATORY_CARE_PROVIDER_SITE_OTHER): Payer: BLUE CROSS/BLUE SHIELD | Admitting: Gynecology

## 2016-08-01 ENCOUNTER — Encounter: Payer: Self-pay | Admitting: Gynecology

## 2016-08-01 VITALS — BP 120/74

## 2016-08-01 DIAGNOSIS — R102 Pelvic and perineal pain: Secondary | ICD-10-CM

## 2016-08-01 NOTE — Patient Instructions (Signed)
Follow up for ultrasound as scheduled 

## 2016-08-01 NOTE — Progress Notes (Signed)
    Leslie Mccoy 1991-03-24 [id number]        24 y.o.  G0P0000 presents having had IUD placed 06/03/2016. Did well without issues until the end of September when she had an episode of nausea vomiting and lower abdominal cramping. These symptoms have resolved but with her last intercourse this past weekend she had a lot of pelvic cramping and said that her partner had felt a strain. Currently without cramping discomfort nausea vomiting diarrhea constipation or urinary tract symptoms.  Past medical history,surgical history, problem list, medications, allergies, family history and social history were all reviewed and documented in the EPIC chart.  Directed ROS with pertinent positives and negatives documented in the history of present illness/assessment and plan.  Exam: Kennon Portela assistant Vitals:   08/01/16 0759  BP: 120/74   General appearance:  Normal Abdomen soft nontender without masses guarding rebound Pelvic external BUS vagina normal. Cervix normal. IUD string normal and appropriate length. String was trimmed.  Uterus normal size midline mobile nontender. Adnexa without masses or tenderness.  Assessment/Plan:  25 y.o. G0P000 with pelvic cramping after intercourse and partner feeling the string. I trimmed the string today. Pelvic exam is normal. Reviewed possibilities to include possible ovulatory cyst with rupture. Had intercourse previously with the IUD without issues. Will check baseline ultrasound now for IUD placement and rule out nonpalpable abnormalities. Patient will follow for this and we'll go from there.    Dara Lords MD, 8:11 AM 08/01/2016

## 2016-08-02 ENCOUNTER — Ambulatory Visit (INDEPENDENT_AMBULATORY_CARE_PROVIDER_SITE_OTHER): Payer: BLUE CROSS/BLUE SHIELD | Admitting: Gynecology

## 2016-08-02 ENCOUNTER — Other Ambulatory Visit: Payer: Self-pay | Admitting: Gynecology

## 2016-08-02 ENCOUNTER — Encounter: Payer: Self-pay | Admitting: Gynecology

## 2016-08-02 ENCOUNTER — Ambulatory Visit (INDEPENDENT_AMBULATORY_CARE_PROVIDER_SITE_OTHER): Payer: BLUE CROSS/BLUE SHIELD

## 2016-08-02 VITALS — BP 116/74

## 2016-08-02 DIAGNOSIS — N8312 Corpus luteum cyst of left ovary: Secondary | ICD-10-CM

## 2016-08-02 DIAGNOSIS — R102 Pelvic and perineal pain: Secondary | ICD-10-CM

## 2016-08-02 DIAGNOSIS — Z30431 Encounter for routine checking of intrauterine contraceptive device: Secondary | ICD-10-CM

## 2016-08-02 NOTE — Patient Instructions (Signed)
Follow up if your pain returns

## 2016-08-02 NOTE — Progress Notes (Addendum)
    Leslie Mccoy Feb 03, 1991 [id number]        25 y.o.  G0P0000 presents for ultrasound. Patient had pain with intercourse. Had IUD placed 06/03/2016. Ultrasound for IUD location and rule out nonpalpable abnormalities.  Past medical history,surgical history, problem list, medications, allergies, family history and social history were all reviewed and documented in the EPIC chart.  Directed ROS with pertinent positives and negatives documented in the history of present illness/assessment and plan.  Exam: Vitals:   08/02/16 1006  BP: 116/74   General appearance:  Normal  Ultrasound shows uterus normal size and echotexture. Endometrial echo 3.6 mm. IUD visualized in normal position. Right and left ovaries grossly normal with physiologic changes. Cul-de-sac with small amount of fluid 23 x 14 mm.  Assessment/Plan:  25 y.o. G0P0000 with history of pelvic pain with intercourse 1 episode. I suspect this was secondary to ovarian cyst which now is resolving. IUD is in proper location. Patient will follow for now and if continues to have pain will follow up for reevaluation. If she remains pain-free then will follow up routinely when due for her annual exam.    Dara Lords MD, 10:21 AM 08/02/2016

## 2016-09-13 DIAGNOSIS — Z79899 Other long term (current) drug therapy: Secondary | ICD-10-CM | POA: Diagnosis not present

## 2016-09-13 DIAGNOSIS — M064 Inflammatory polyarthropathy: Secondary | ICD-10-CM | POA: Diagnosis not present

## 2016-09-13 DIAGNOSIS — M329 Systemic lupus erythematosus, unspecified: Secondary | ICD-10-CM | POA: Diagnosis not present

## 2016-09-13 DIAGNOSIS — Z006 Encounter for examination for normal comparison and control in clinical research program: Secondary | ICD-10-CM | POA: Diagnosis not present

## 2016-09-13 DIAGNOSIS — Z23 Encounter for immunization: Secondary | ICD-10-CM | POA: Diagnosis not present

## 2016-09-30 ENCOUNTER — Encounter (HOSPITAL_COMMUNITY): Payer: Self-pay | Admitting: Emergency Medicine

## 2016-09-30 ENCOUNTER — Emergency Department (HOSPITAL_COMMUNITY)
Admission: EM | Admit: 2016-09-30 | Discharge: 2016-09-30 | Disposition: A | Discharge status: Home / Self Care | Payer: BLUE CROSS/BLUE SHIELD | Attending: Emergency Medicine | Admitting: Emergency Medicine

## 2016-09-30 ENCOUNTER — Emergency Department (HOSPITAL_COMMUNITY): Payer: BLUE CROSS/BLUE SHIELD

## 2016-09-30 DIAGNOSIS — R079 Chest pain, unspecified: Secondary | ICD-10-CM | POA: Diagnosis not present

## 2016-09-30 DIAGNOSIS — R0789 Other chest pain: Secondary | ICD-10-CM | POA: Diagnosis not present

## 2016-09-30 DIAGNOSIS — Z791 Long term (current) use of non-steroidal anti-inflammatories (NSAID): Secondary | ICD-10-CM | POA: Insufficient documentation

## 2016-09-30 LAB — CBC
HEMATOCRIT: 35.5 % — AB (ref 36.0–46.0)
HEMOGLOBIN: 12 g/dL (ref 12.0–15.0)
MCH: 33.1 pg (ref 26.0–34.0)
MCHC: 33.8 g/dL (ref 30.0–36.0)
MCV: 97.8 fL (ref 78.0–100.0)
Platelets: 146 10*3/uL — ABNORMAL LOW (ref 150–400)
RBC: 3.63 MIL/uL — ABNORMAL LOW (ref 3.87–5.11)
RDW: 13 % (ref 11.5–15.5)
WBC: 2.6 10*3/uL — AB (ref 4.0–10.5)

## 2016-09-30 LAB — BASIC METABOLIC PANEL
ANION GAP: 6 (ref 5–15)
BUN: 9 mg/dL (ref 6–20)
CALCIUM: 8.9 mg/dL (ref 8.9–10.3)
CO2: 26 mmol/L (ref 22–32)
CREATININE: 0.74 mg/dL (ref 0.44–1.00)
Chloride: 107 mmol/L (ref 101–111)
GFR calc non Af Amer: >60 mL/min (ref >60)
Glucose, Bld: 84 mg/dL (ref 65–99)
Potassium: 3.5 mmol/L (ref 3.5–5.1)
SODIUM: 139 mmol/L (ref 135–145)

## 2016-09-30 LAB — TROPONIN I

## 2016-09-30 MED ORDER — NAPROXEN 500 MG PO TABS: 500.0000 mg | ORAL_TABLET | Freq: Two times a day (BID) | ORAL | 1 refills | Status: DC

## 2016-09-30 NOTE — Discharge Instructions (Signed)
Today's workup without any acute findings. Recommend making upon a follow-up with your doctor. Trial of Naprosyn for the next 7 days prescription provided. Also sold over-the-counter as Aleve. Return for any new or worse symptoms.

## 2016-09-30 NOTE — ED Triage Notes (Addendum)
Patient complaining of chest pain under left breast x 2 weeks. States she was told by PCP to come to ER for EKG and chest x-ray. States she was recently diagnosed with Lupus. States pain is worse with deep breath or cough.

## 2016-09-30 NOTE — ED Provider Notes (Signed)
[facility] DEPT Provider Note   CSN: [id number] Arrival date & time: 09/30/16  1319     History   Chief Complaint Chief Complaint  Patient presents with  . Chest Pain    HPI Leslie Mccoy is a 25 y.o. female.  Patient 2 week history of left anterior lateral lower chest pain. Made worse by trying to sit up and lay down. Also made worse little bit by moving the left arm. Nursing also related that worse with deep breath or cough. No abdominal pain. No fevers no nausea vomiting or diarrhea no leg swelling. Patient has a diagnosis of lupus. Was on prednisone in the past but is not now. Her physicians prefer to be off prednisone. Patient denies any injury to the chest wall.      Past Medical History:  Diagnosis Date  . Depression   . Lupus 01/2016    Patient Active Problem List   Diagnosis Date Noted  . Lupus 02/20/2016    Past Surgical History:  Procedure Laterality Date  . INTRAUTERINE DEVICE INSERTION  05/2016   Mirena  . Moles excised      OB History    Gravida Para Term Preterm AB Living   0 0 0 0 0 0   SAB TAB Ectopic Multiple Live Births   0 0 0 0         Home Medications    Prior to Admission medications   Medication Sig Start Date End Date Taking? Authorizing Provider  BIOTIN PO Take by mouth.    Historical Provider, MD  buPROPion (WELLBUTRIN XL) 300 MG 24 hr tablet Take 300 mg by mouth daily.    Historical Provider, MD  Cetirizine HCl (ZYRTEC PO) Take by mouth.    Historical Provider, MD  DICLOFENAC PO Take by mouth.    Historical Provider, MD  folic acid (FOLVITE) 1 MG tablet Take 1 mg by mouth daily.    Historical Provider, MD  hydroxychloroquine (PLAQUENIL) 200 MG tablet Take 200 mg by mouth daily.    Historical Provider, MD  ibuprofen (ADVIL,MOTRIN) 600 MG tablet Take 1 tablet (600 mg total) by mouth every 8 (eight) hours as needed. 01/10/16   Harrington Challenger, NP  levonorgestrel (MIRENA) 20 MCG/24HR IUD 1 each by Intrauterine route once.     Historical Provider, MD  LORAZEPAM PO Take by mouth.    Historical Provider, MD  methotrexate 2.5 MG tablet Take 2.5 mg by mouth 3 (three) times a week.    Historical Provider, MD  naproxen (NAPROSYN) 500 MG tablet Take 1 tablet (500 mg total) by mouth 2 (two) times daily. 09/30/16   Vanetta Mulders, MD  TURMERIC PO Take by mouth.    Historical Provider, MD    Family History Family History  Problem Relation Age of Onset  . Depression Mother   . Alcoholism Mother   . Bipolar disorder Sister   . Breast cancer Maternal Aunt 88    Social History Social History  Substance Use Topics  . Smoking status: Never Smoker  . Smokeless tobacco: Never Used  . Alcohol use No     Comment: Social     Allergies   Patient has no known allergies.   Review of Systems Review of Systems  Constitutional: Negative for fever.  HENT: Negative for congestion.   Eyes: Negative for visual disturbance.  Respiratory: Negative for shortness of breath.   Cardiovascular: Positive for chest pain. Negative for leg swelling.  Gastrointestinal: Negative for abdominal pain, diarrhea, nausea  and vomiting.  Genitourinary: Negative for dysuria.  Musculoskeletal: Negative for back pain and neck pain.  Neurological: Negative for syncope and headaches.  Hematological: Does not bruise/bleed easily.  Psychiatric/Behavioral: Negative for confusion.     Physical Exam Updated Vital Signs BP 113/75 (BP Location: Left Arm)   Pulse 68   Temp 97.8 F (36.6 C) (Oral)   Resp 14   Ht 5\' 5"  (1.651 m)   Wt 63.5 kg   LMP 09/25/2016   SpO2 99%   BMI 23.30 kg/m   Physical Exam  Constitutional: She is oriented to person, place, and time. She appears well-developed and well-nourished. No distress.  HENT:  Head: Normocephalic and atraumatic.  Mouth/Throat: Oropharynx is clear and moist.  Eyes: EOM are normal. Pupils are equal, round, and reactive to light.  Neck: Normal range of motion. Neck supple.  Cardiovascular:  Normal rate, regular rhythm and normal heart sounds.   Pulmonary/Chest: Effort normal and breath sounds normal. No respiratory distress. She exhibits no tenderness.  Abdominal: Soft. Bowel sounds are normal. There is no tenderness.  Musculoskeletal: Normal range of motion. She exhibits no edema.  Neurological: She is alert and oriented to person, place, and time. No cranial nerve deficit or sensory deficit. She exhibits normal muscle tone. Coordination normal.  Skin: Skin is warm.  Nursing note and vitals reviewed.    ED Treatments / Results  Labs (all labs ordered are listed, but only abnormal results are displayed) Labs Reviewed  CBC - Abnormal; Notable for the following:       Result Value   WBC 2.6 (*)    RBC 3.63 (*)    HCT 35.5 (*)    Platelets 146 (*)    All other components within normal limits  BASIC METABOLIC PANEL  TROPONIN I    EKG  EKG Interpretation None       Radiology Dg Chest 2 View  Result Date: 09/30/2016 CLINICAL DATA:  25 y/o F; left anterior chest pain and shortness of breath for 2 weeks. EXAM: CHEST  2 VIEW COMPARISON:  09/20/2007 chest radiograph. FINDINGS: The heart size and mediastinal contours are within normal limits and stable. Both lungs are clear. The visualized skeletal structures are unremarkable. IMPRESSION: No active cardiopulmonary disease. Electronically Signed   By: Mitzi HansenLance  Furusawa-Stratton M.D.   On: 09/30/2016 13:47    Procedures Procedures (including critical care time)  Medications Ordered in ED Medications - No data to display   Initial Impression / Assessment and Plan / ED Course  I have reviewed the triage vital signs and the nursing notes.  Pertinent labs & imaging results that were available during my care of the patient were reviewed by me and considered in my medical decision making (see chart for details).  Clinical Course     Patient with 2 week history of left sided anterior lower chest pain. Made worse by trying  to lay down and sit up. No history of any trauma. Made a little bit worse by moving the arm. Not this really made worse by taking a deep breath. No real abdominal pain no nausea vomiting or diarrhea or fevers. Patient has a history of lupus. Had been on prednisone in the past but has been off of other medications.  Workup chest x-ray troponin labs without any acute findings. Patient has no leg swelling. Room air sats are 100%. Not tachycardic. Not concerned about pulmonary embolus. While patient follow back up with primary care provider we'll give her a trial of  Naprosyn for 7 days.  Final Clinical Impressions(s) / ED Diagnoses   Final diagnoses:  Chest wall pain    New Prescriptions New Prescriptions   NAPROXEN (NAPROSYN) 500 MG TABLET    Take 1 tablet (500 mg total) by mouth 2 (two) times daily.     Vanetta Mulders, MD 09/30/16 1630

## 2016-10-16 DIAGNOSIS — L710 Perioral dermatitis: Secondary | ICD-10-CM | POA: Diagnosis not present

## 2016-11-08 DIAGNOSIS — J029 Acute pharyngitis, unspecified: Secondary | ICD-10-CM | POA: Diagnosis not present

## 2016-12-20 DIAGNOSIS — Z79899 Other long term (current) drug therapy: Secondary | ICD-10-CM | POA: Diagnosis not present

## 2016-12-20 DIAGNOSIS — M329 Systemic lupus erythematosus, unspecified: Secondary | ICD-10-CM | POA: Diagnosis not present

## 2017-01-31 DIAGNOSIS — M329 Systemic lupus erythematosus, unspecified: Secondary | ICD-10-CM | POA: Diagnosis not present

## 2017-01-31 DIAGNOSIS — D229 Melanocytic nevi, unspecified: Secondary | ICD-10-CM | POA: Diagnosis not present

## 2017-01-31 DIAGNOSIS — L2084 Intrinsic (allergic) eczema: Secondary | ICD-10-CM | POA: Diagnosis not present

## 2017-01-31 DIAGNOSIS — L0100 Impetigo, unspecified: Secondary | ICD-10-CM | POA: Diagnosis not present

## 2017-02-20 DIAGNOSIS — B351 Tinea unguium: Secondary | ICD-10-CM | POA: Diagnosis not present

## 2017-02-20 DIAGNOSIS — L710 Perioral dermatitis: Secondary | ICD-10-CM | POA: Diagnosis not present

## 2017-04-10 DIAGNOSIS — Z0000 Encounter for general adult medical examination without abnormal findings: Secondary | ICD-10-CM | POA: Diagnosis not present

## 2017-04-16 DIAGNOSIS — Z0000 Encounter for general adult medical examination without abnormal findings: Secondary | ICD-10-CM | POA: Diagnosis not present

## 2017-04-16 DIAGNOSIS — M3210 Systemic lupus erythematosus, organ or system involvement unspecified: Secondary | ICD-10-CM | POA: Diagnosis not present

## 2017-04-16 DIAGNOSIS — F329 Major depressive disorder, single episode, unspecified: Secondary | ICD-10-CM | POA: Diagnosis not present

## 2017-06-17 DIAGNOSIS — Z79899 Other long term (current) drug therapy: Secondary | ICD-10-CM | POA: Diagnosis not present

## 2017-06-17 DIAGNOSIS — H5213 Myopia, bilateral: Secondary | ICD-10-CM | POA: Diagnosis not present

## 2017-07-02 DIAGNOSIS — F419 Anxiety disorder, unspecified: Secondary | ICD-10-CM | POA: Diagnosis not present

## 2017-07-02 DIAGNOSIS — D6862 Lupus anticoagulant syndrome: Secondary | ICD-10-CM | POA: Diagnosis not present

## 2017-07-02 DIAGNOSIS — M064 Inflammatory polyarthropathy: Secondary | ICD-10-CM | POA: Diagnosis not present

## 2017-07-02 DIAGNOSIS — F329 Major depressive disorder, single episode, unspecified: Secondary | ICD-10-CM | POA: Diagnosis not present

## 2017-07-02 DIAGNOSIS — Z23 Encounter for immunization: Secondary | ICD-10-CM | POA: Diagnosis not present

## 2017-07-02 DIAGNOSIS — Z79899 Other long term (current) drug therapy: Secondary | ICD-10-CM | POA: Diagnosis not present

## 2017-07-02 DIAGNOSIS — M329 Systemic lupus erythematosus, unspecified: Secondary | ICD-10-CM | POA: Diagnosis not present

## 2017-07-02 DIAGNOSIS — R51 Headache: Secondary | ICD-10-CM | POA: Diagnosis not present

## 2017-07-17 DIAGNOSIS — D2262 Melanocytic nevi of left upper limb, including shoulder: Secondary | ICD-10-CM | POA: Diagnosis not present

## 2017-07-17 DIAGNOSIS — D2239 Melanocytic nevi of other parts of face: Secondary | ICD-10-CM | POA: Diagnosis not present

## 2017-07-17 DIAGNOSIS — D485 Neoplasm of uncertain behavior of skin: Secondary | ICD-10-CM | POA: Diagnosis not present

## 2017-07-17 DIAGNOSIS — D225 Melanocytic nevi of trunk: Secondary | ICD-10-CM | POA: Diagnosis not present

## 2017-07-17 DIAGNOSIS — D2261 Melanocytic nevi of right upper limb, including shoulder: Secondary | ICD-10-CM | POA: Diagnosis not present

## 2017-10-15 DIAGNOSIS — M329 Systemic lupus erythematosus, unspecified: Secondary | ICD-10-CM | POA: Diagnosis not present

## 2017-10-15 DIAGNOSIS — Z006 Encounter for examination for normal comparison and control in clinical research program: Secondary | ICD-10-CM | POA: Diagnosis not present

## 2017-10-15 DIAGNOSIS — Z79899 Other long term (current) drug therapy: Secondary | ICD-10-CM | POA: Diagnosis not present

## 2018-01-21 DIAGNOSIS — Z79899 Other long term (current) drug therapy: Secondary | ICD-10-CM | POA: Diagnosis not present

## 2018-01-21 DIAGNOSIS — M329 Systemic lupus erythematosus, unspecified: Secondary | ICD-10-CM | POA: Diagnosis not present

## 2018-01-21 DIAGNOSIS — R5382 Chronic fatigue, unspecified: Secondary | ICD-10-CM | POA: Diagnosis not present

## 2018-01-31 IMAGING — DX DG CHEST 2V
2 series · 2 of 2 positions shown · non-contrast
Comparison: 09/20/2007 chest radiograph.

CLINICAL DATA: 25 y/o F; left anterior chest pain and shortness of
breath for 2 weeks.

EXAM:
CHEST  2 VIEW

[chest pa]
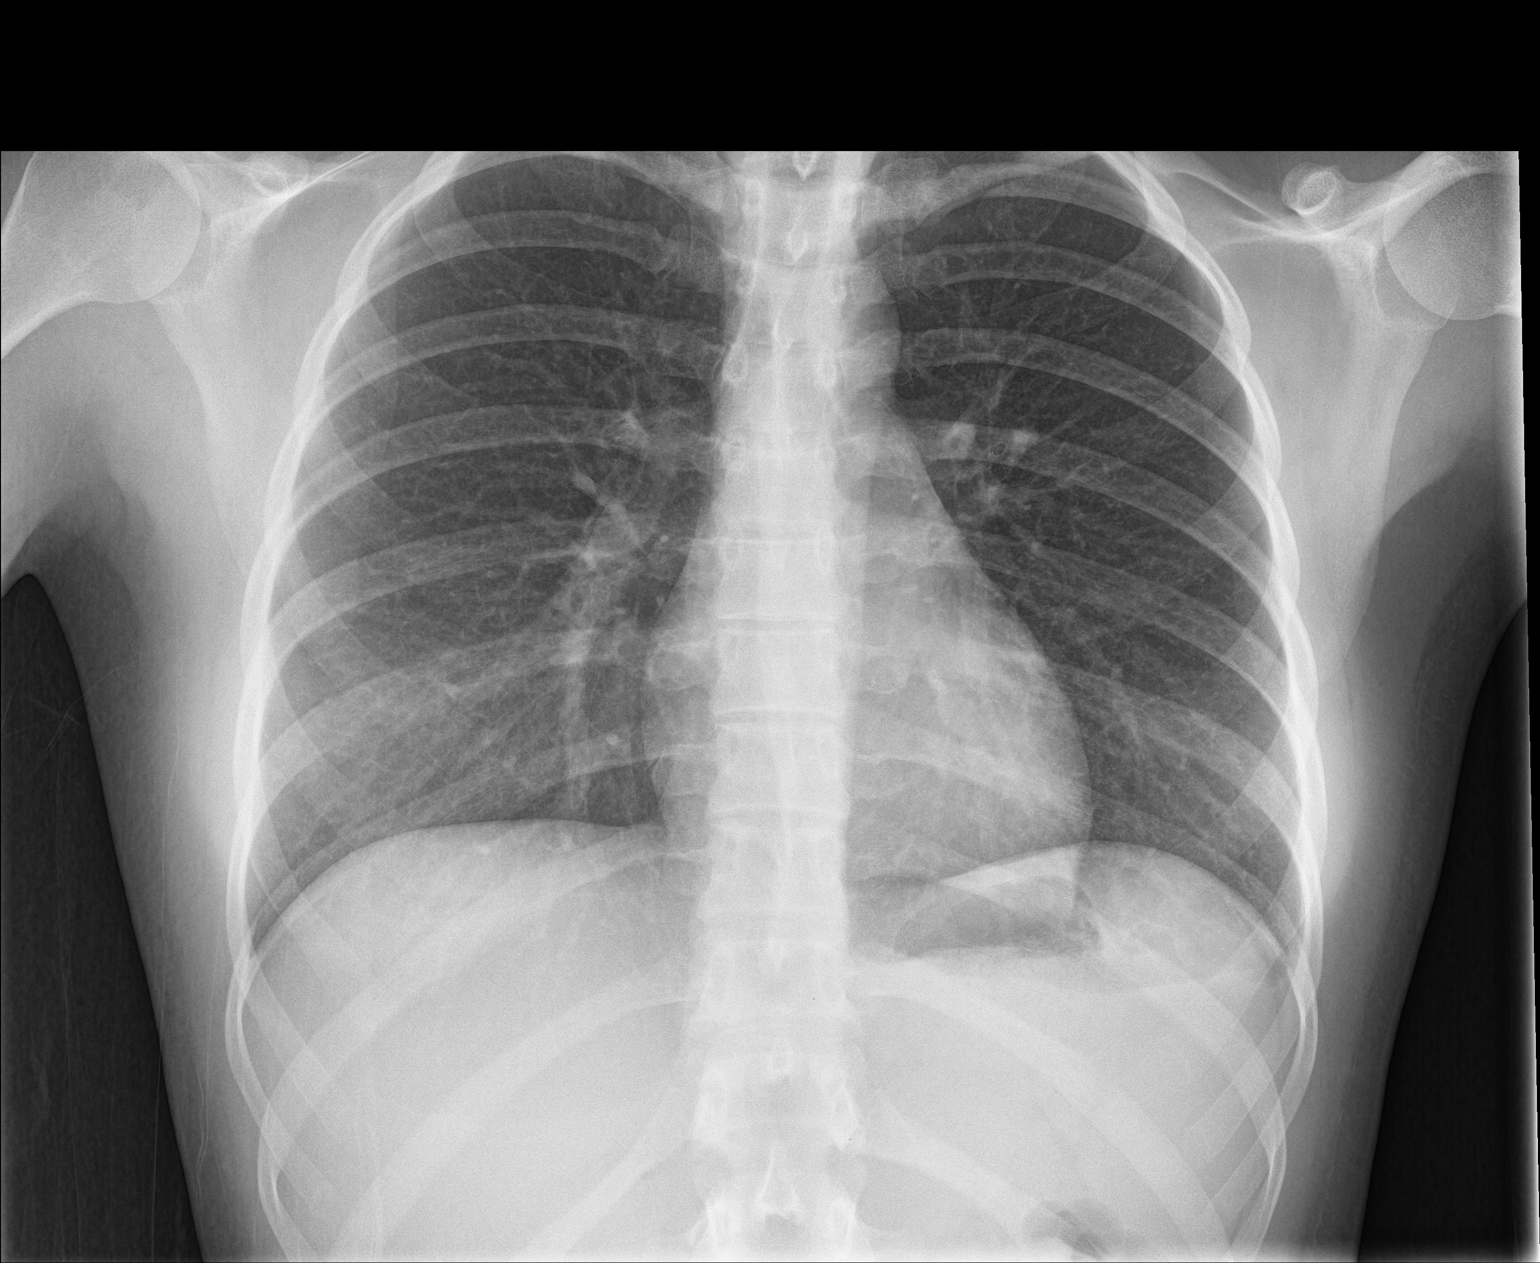

[chest lat]
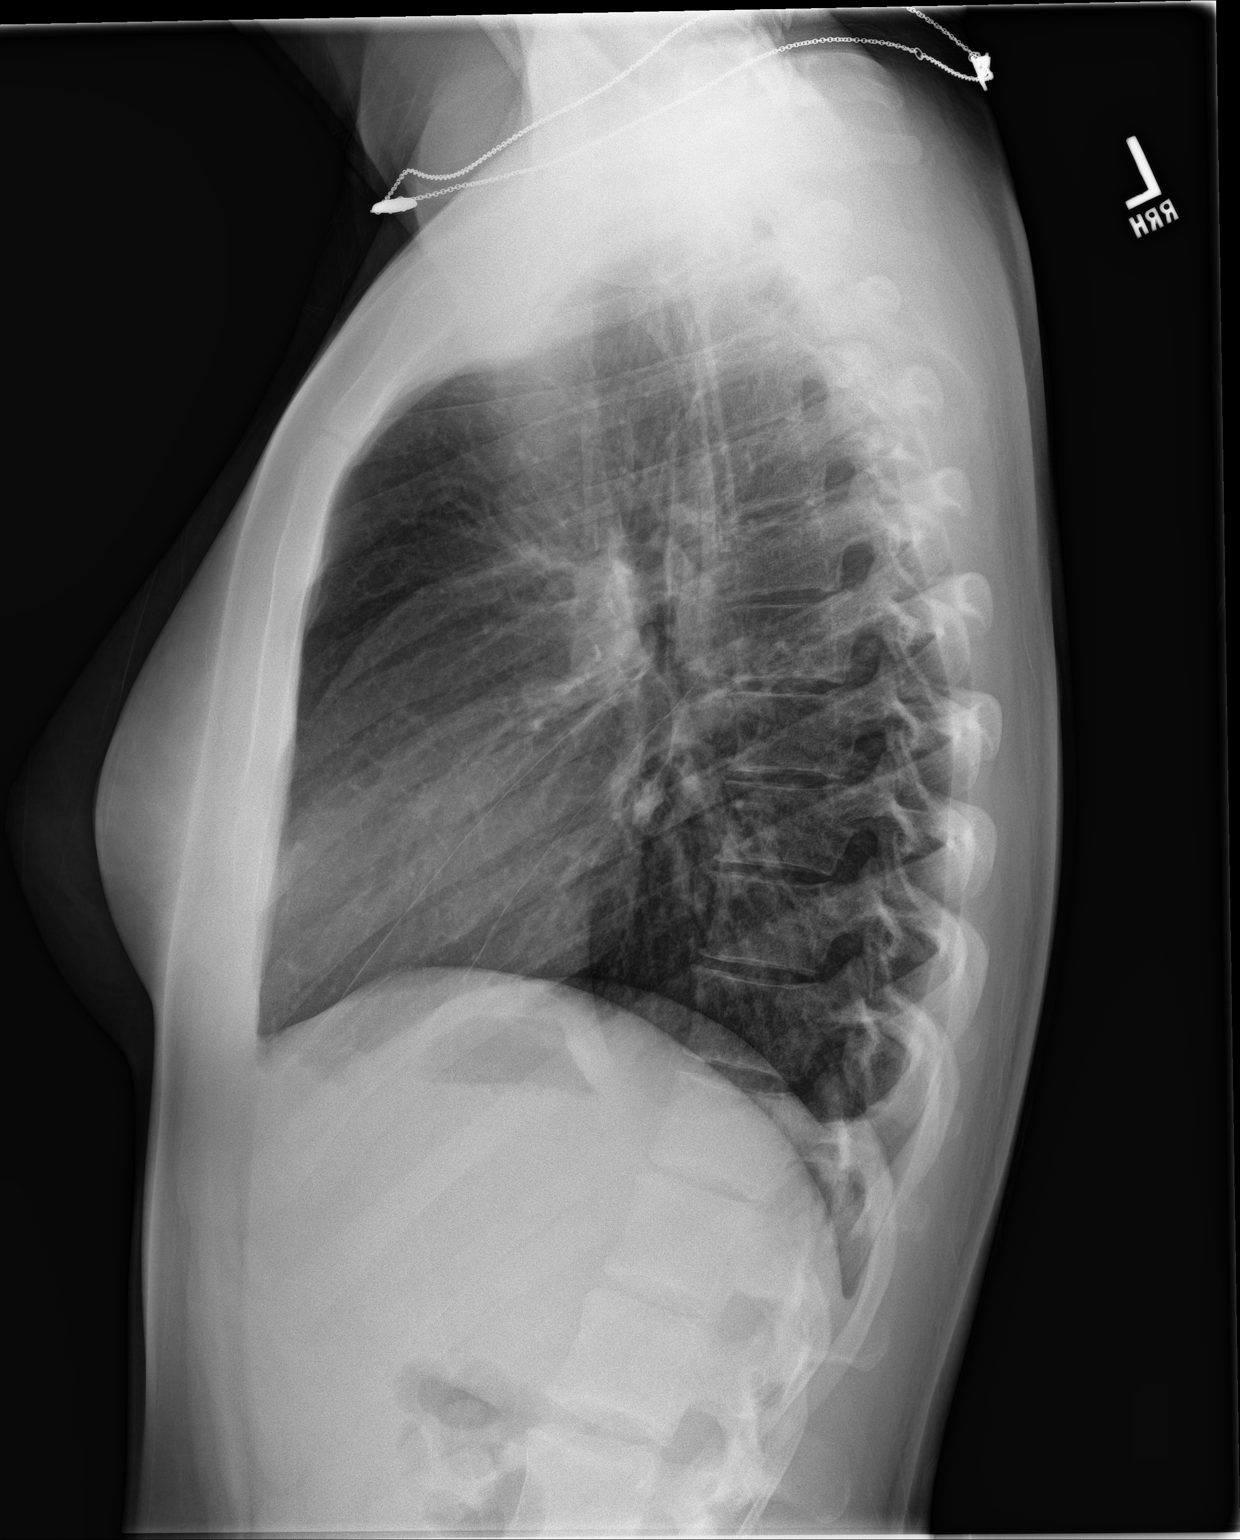

[2 of 2 positions shown; findings below may reference images not displayed]

FINDINGS: The heart size and mediastinal contours are within normal limits and
stable. Both lungs are clear. The visualized skeletal structures are
unremarkable.
IMPRESSION: No active cardiopulmonary disease.

By: Robert L Ang M.D.

## 2018-04-02 DIAGNOSIS — R0600 Dyspnea, unspecified: Secondary | ICD-10-CM | POA: Diagnosis not present

## 2018-04-02 DIAGNOSIS — F329 Major depressive disorder, single episode, unspecified: Secondary | ICD-10-CM | POA: Diagnosis not present

## 2018-04-02 DIAGNOSIS — J302 Other seasonal allergic rhinitis: Secondary | ICD-10-CM | POA: Diagnosis not present

## 2018-04-02 DIAGNOSIS — F419 Anxiety disorder, unspecified: Secondary | ICD-10-CM | POA: Diagnosis not present

## 2018-04-10 DIAGNOSIS — M3210 Systemic lupus erythematosus, organ or system involvement unspecified: Secondary | ICD-10-CM | POA: Diagnosis not present

## 2018-04-10 DIAGNOSIS — Z6824 Body mass index (BMI) 24.0-24.9, adult: Secondary | ICD-10-CM | POA: Diagnosis not present

## 2018-04-10 DIAGNOSIS — R0609 Other forms of dyspnea: Secondary | ICD-10-CM | POA: Diagnosis not present

## 2018-04-17 ENCOUNTER — Other Ambulatory Visit: Payer: Self-pay | Admitting: Internal Medicine

## 2018-04-17 ENCOUNTER — Ambulatory Visit (INDEPENDENT_AMBULATORY_CARE_PROVIDER_SITE_OTHER): Payer: BLUE CROSS/BLUE SHIELD | Admitting: Internal Medicine

## 2018-04-17 DIAGNOSIS — M329 Systemic lupus erythematosus, unspecified: Secondary | ICD-10-CM | POA: Diagnosis not present

## 2018-04-17 DIAGNOSIS — IMO0002 Reserved for concepts with insufficient information to code with codable children: Secondary | ICD-10-CM

## 2018-04-17 LAB — PULMONARY FUNCTION TEST
DL/VA % PRED: 89 %
DL/VA: 4.47 ml/min/mmHg/L
DLCO UNC % PRED: 89 %
DLCO unc: 23.63 ml/min/mmHg
FEF 25-75 POST: 4.72 L/s
FEF 25-75 PRE: 4.32 L/s
FEF2575-%Change-Post: 9 %
FEF2575-%PRED-PRE: 117 %
FEF2575-%Pred-Post: 128 %
FEV1-%Change-Post: 5 %
FEV1-%PRED-POST: 111 %
FEV1-%Pred-Pre: 105 %
FEV1-PRE: 3.58 L
FEV1-Post: 3.78 L
FEV1FVC-%CHANGE-POST: 6 %
FEV1FVC-%Pred-Pre: 97 %
FEV6-%CHANGE-POST: 3 %
FEV6-%PRED-POST: 108 %
FEV6-%PRED-PRE: 105 %
FEV6-Post: 4.28 L
FEV6-Pre: 4.15 L
FEV6FVC-%PRED-PRE: 101 %
FEV6FVC-%Pred-Post: 101 %
FVC-%Change-Post: 0 %
FVC-%Pred-Post: 108 %
FVC-%Pred-Pre: 109 %
FVC-Post: 4.3 L
FVC-Pre: 4.33 L
POST FEV1/FVC RATIO: 88 %
POST FEV6/FVC RATIO: 100 %
PRE FEV1/FVC RATIO: 83 %
Pre FEV6/FVC Ratio: 100 %
RV % pred: 134 %
RV: 1.85 L
TLC % PRED: 113 %
TLC: 6.01 L

## 2018-04-17 NOTE — Progress Notes (Signed)
PFT completed today. 04/17/18  

## 2018-05-20 DIAGNOSIS — Z5181 Encounter for therapeutic drug level monitoring: Secondary | ICD-10-CM | POA: Diagnosis not present

## 2018-05-20 DIAGNOSIS — M329 Systemic lupus erythematosus, unspecified: Secondary | ICD-10-CM | POA: Diagnosis not present

## 2018-05-20 DIAGNOSIS — Z006 Encounter for examination for normal comparison and control in clinical research program: Secondary | ICD-10-CM | POA: Diagnosis not present

## 2018-06-18 DIAGNOSIS — Z79899 Other long term (current) drug therapy: Secondary | ICD-10-CM | POA: Diagnosis not present

## 2018-06-18 DIAGNOSIS — H5213 Myopia, bilateral: Secondary | ICD-10-CM | POA: Diagnosis not present

## 2018-07-16 DIAGNOSIS — D224 Melanocytic nevi of scalp and neck: Secondary | ICD-10-CM | POA: Diagnosis not present

## 2018-07-16 DIAGNOSIS — D225 Melanocytic nevi of trunk: Secondary | ICD-10-CM | POA: Diagnosis not present

## 2018-07-16 DIAGNOSIS — D485 Neoplasm of uncertain behavior of skin: Secondary | ICD-10-CM | POA: Diagnosis not present

## 2018-07-16 DIAGNOSIS — D2262 Melanocytic nevi of left upper limb, including shoulder: Secondary | ICD-10-CM | POA: Diagnosis not present

## 2018-08-26 DIAGNOSIS — Z006 Encounter for examination for normal comparison and control in clinical research program: Secondary | ICD-10-CM | POA: Diagnosis not present

## 2018-08-26 DIAGNOSIS — M329 Systemic lupus erythematosus, unspecified: Secondary | ICD-10-CM | POA: Diagnosis not present

## 2018-08-26 DIAGNOSIS — R110 Nausea: Secondary | ICD-10-CM | POA: Diagnosis not present

## 2018-08-26 DIAGNOSIS — Z79899 Other long term (current) drug therapy: Secondary | ICD-10-CM | POA: Diagnosis not present

## 2018-11-09 ENCOUNTER — Ambulatory Visit: Payer: BLUE CROSS/BLUE SHIELD | Admitting: Women's Health

## 2018-11-09 ENCOUNTER — Encounter: Payer: Self-pay | Admitting: Women's Health

## 2018-11-09 VITALS — BP 120/78 | Ht 66.0 in | Wt 151.0 lb

## 2018-11-09 DIAGNOSIS — Z01419 Encounter for gynecological examination (general) (routine) without abnormal findings: Secondary | ICD-10-CM | POA: Diagnosis not present

## 2018-11-09 NOTE — Progress Notes (Signed)
Leslie Mccoy 1991/03/01 [id number]    History:    Presents for annual exam.  05/2016 Mirena IUD amenorrhea, ultrasound confirming proper placement.  Gardasil series completed.  Normal Pap history.  Same partner many years.  Marriage scheduled for May.  Lupus diagnosed in 2017 goes to the lupus clinic at [facility] for management on methotrexate and Plaquenil is aware needs to be off methotrexate prior to even trying to conceive..  Not planning pregnancy for at least 2 years.  Past medical history, past surgical history, family history and social history were all reviewed and documented in the EPIC chart.  Works for [employer].    ROS:  A ROS was performed and pertinent positives and negatives are included.  Exam:  Vitals:   11/09/18 1456  BP: 120/78  Weight: 151 lb (68.5 kg)  Height: 5\' 6"  (1.676 m)   Body mass index is 24.37 kg/m.   General appearance:  Normal Thyroid:  Symmetrical, normal in size, without palpable masses or nodularity. Respiratory  Auscultation:  Clear without wheezing or rhonchi Cardiovascular  Auscultation:  Regular rate, without rubs, murmurs or gallops  Edema/varicosities:  Not grossly evident Abdominal  Soft,nontender, without masses, guarding or rebound.  Liver/spleen:  No organomegaly noted  Hernia:  None appreciated  Skin  Inspection:  Grossly normal   Breasts: Examined lying and sitting.     Right: Without masses, retractions, discharge or axillary adenopathy.     Left: Without masses, retractions, discharge or axillary adenopathy. Gentitourinary   Inguinal/mons:  Normal without inguinal adenopathy  External genitalia:  Normal  BUS/Urethra/Skene's glands:  Normal  Vagina:  Normal  Cervix:  Normal as directed strings not visible/ultrasound confirmed placement in the past  Uterus:   normal in size, shape and contour.  Midline and mobile  Adnexa/parametria:     Rt: Without masses or tenderness.   Lt: Without masses or tenderness.  Anus and  perineum: Normal    Assessment/Plan:  28 y.o. engaged WF G0 for annual exam no GYN complaints.  05/2016 Mirena IUD amenorrhea 01/2016 lupus affecting joints [facility] manages Anxiety/depression primary care manages  Plan: Discussion concerning pregnancy and lupus, will discuss with physicians at [facility].  Aware needs to be off methotrexate prior to trying to conceive.  Continue folic acid, SBEs, exercise, calcium rich foods, adequate rest encouraged.  Instructed to have a rubella titer at next blood draw.  Pap.     Harrington Challenger [facility], 3:50 PM 11/09/2018

## 2018-11-09 NOTE — Patient Instructions (Signed)

## 2018-11-10 LAB — PAP IG W/ RFLX HPV ASCU

## 2018-12-30 DIAGNOSIS — Z006 Encounter for examination for normal comparison and control in clinical research program: Secondary | ICD-10-CM | POA: Diagnosis not present

## 2018-12-30 DIAGNOSIS — M797 Fibromyalgia: Secondary | ICD-10-CM | POA: Diagnosis not present

## 2018-12-30 DIAGNOSIS — G43909 Migraine, unspecified, not intractable, without status migrainosus: Secondary | ICD-10-CM | POA: Diagnosis not present

## 2018-12-30 DIAGNOSIS — D696 Thrombocytopenia, unspecified: Secondary | ICD-10-CM | POA: Diagnosis not present

## 2018-12-30 DIAGNOSIS — Z5181 Encounter for therapeutic drug level monitoring: Secondary | ICD-10-CM | POA: Diagnosis not present

## 2018-12-30 DIAGNOSIS — Z79899 Other long term (current) drug therapy: Secondary | ICD-10-CM | POA: Diagnosis not present

## 2018-12-30 DIAGNOSIS — M329 Systemic lupus erythematosus, unspecified: Secondary | ICD-10-CM | POA: Diagnosis not present

## 2019-04-14 DIAGNOSIS — M329 Systemic lupus erythematosus, unspecified: Secondary | ICD-10-CM | POA: Diagnosis not present

## 2019-04-26 DIAGNOSIS — M329 Systemic lupus erythematosus, unspecified: Secondary | ICD-10-CM | POA: Diagnosis not present

## 2019-05-06 DIAGNOSIS — R0600 Dyspnea, unspecified: Secondary | ICD-10-CM | POA: Diagnosis not present

## 2019-05-06 DIAGNOSIS — M3210 Systemic lupus erythematosus, organ or system involvement unspecified: Secondary | ICD-10-CM | POA: Diagnosis not present

## 2019-06-22 DIAGNOSIS — H5213 Myopia, bilateral: Secondary | ICD-10-CM | POA: Diagnosis not present

## 2019-06-22 DIAGNOSIS — Z79899 Other long term (current) drug therapy: Secondary | ICD-10-CM | POA: Diagnosis not present

## 2019-06-22 DIAGNOSIS — M3210 Systemic lupus erythematosus, organ or system involvement unspecified: Secondary | ICD-10-CM | POA: Diagnosis not present

## 2019-06-30 DIAGNOSIS — F419 Anxiety disorder, unspecified: Secondary | ICD-10-CM | POA: Diagnosis not present

## 2019-06-30 DIAGNOSIS — Z1331 Encounter for screening for depression: Secondary | ICD-10-CM | POA: Diagnosis not present

## 2019-06-30 DIAGNOSIS — Z0000 Encounter for general adult medical examination without abnormal findings: Secondary | ICD-10-CM | POA: Diagnosis not present

## 2019-06-30 DIAGNOSIS — M3210 Systemic lupus erythematosus, organ or system involvement unspecified: Secondary | ICD-10-CM | POA: Diagnosis not present

## 2019-06-30 DIAGNOSIS — F329 Major depressive disorder, single episode, unspecified: Secondary | ICD-10-CM | POA: Diagnosis not present

## 2019-06-30 DIAGNOSIS — J302 Other seasonal allergic rhinitis: Secondary | ICD-10-CM | POA: Diagnosis not present

## 2019-07-12 DIAGNOSIS — M329 Systemic lupus erythematosus, unspecified: Secondary | ICD-10-CM | POA: Diagnosis not present

## 2019-07-19 ENCOUNTER — Encounter: Payer: Self-pay | Admitting: Gynecology

## 2019-08-09 DIAGNOSIS — D224 Melanocytic nevi of scalp and neck: Secondary | ICD-10-CM | POA: Diagnosis not present

## 2019-08-09 DIAGNOSIS — D225 Melanocytic nevi of trunk: Secondary | ICD-10-CM | POA: Diagnosis not present

## 2019-08-09 DIAGNOSIS — D2262 Melanocytic nevi of left upper limb, including shoulder: Secondary | ICD-10-CM | POA: Diagnosis not present

## 2019-08-09 DIAGNOSIS — D2261 Melanocytic nevi of right upper limb, including shoulder: Secondary | ICD-10-CM | POA: Diagnosis not present

## 2019-10-13 DIAGNOSIS — M329 Systemic lupus erythematosus, unspecified: Secondary | ICD-10-CM | POA: Diagnosis not present

## 2019-10-13 DIAGNOSIS — Z79899 Other long term (current) drug therapy: Secondary | ICD-10-CM | POA: Diagnosis not present

## 2019-10-13 DIAGNOSIS — Z006 Encounter for examination for normal comparison and control in clinical research program: Secondary | ICD-10-CM | POA: Diagnosis not present

## 2019-10-13 DIAGNOSIS — Z975 Presence of (intrauterine) contraceptive device: Secondary | ICD-10-CM | POA: Diagnosis not present

## 2019-10-13 DIAGNOSIS — F419 Anxiety disorder, unspecified: Secondary | ICD-10-CM | POA: Diagnosis not present

## 2019-10-13 DIAGNOSIS — F329 Major depressive disorder, single episode, unspecified: Secondary | ICD-10-CM | POA: Diagnosis not present

## 2019-10-27 ENCOUNTER — Ambulatory Visit: Payer: Self-pay | Attending: Internal Medicine

## 2019-10-27 DIAGNOSIS — Z20822 Contact with and (suspected) exposure to covid-19: Secondary | ICD-10-CM

## 2019-10-27 DIAGNOSIS — Z20828 Contact with and (suspected) exposure to other viral communicable diseases: Secondary | ICD-10-CM | POA: Insufficient documentation

## 2019-10-28 LAB — NOVEL CORONAVIRUS, NAA: SARS-CoV-2, NAA: NOT DETECTED

## 2019-11-10 ENCOUNTER — Ambulatory Visit: Payer: Self-pay | Attending: Internal Medicine

## 2019-11-10 DIAGNOSIS — Z20822 Contact with and (suspected) exposure to covid-19: Secondary | ICD-10-CM | POA: Insufficient documentation

## 2019-11-11 LAB — NOVEL CORONAVIRUS, NAA: SARS-CoV-2, NAA: NOT DETECTED

## 2019-11-15 ENCOUNTER — Encounter: Payer: BLUE CROSS/BLUE SHIELD | Admitting: Women's Health

## 2019-12-13 ENCOUNTER — Other Ambulatory Visit: Payer: Self-pay

## 2019-12-15 ENCOUNTER — Other Ambulatory Visit: Payer: Self-pay

## 2019-12-15 ENCOUNTER — Ambulatory Visit (INDEPENDENT_AMBULATORY_CARE_PROVIDER_SITE_OTHER): Payer: PRIVATE HEALTH INSURANCE | Admitting: Women's Health

## 2019-12-15 ENCOUNTER — Encounter: Payer: Self-pay | Admitting: Women's Health

## 2019-12-15 VITALS — BP 118/80

## 2019-12-15 DIAGNOSIS — Z01419 Encounter for gynecological examination (general) (routine) without abnormal findings: Secondary | ICD-10-CM

## 2019-12-15 NOTE — Progress Notes (Signed)
Leslie Mccoy 1991-05-04 [id number]    History:    Presents for annual exam.  05/2016 Mirena IUD amenorrheic.  Normal Pap history.  Gardasil series completed.  Lupus has stopped the methotrexate this past year and is now on Plaquenil and Imuran.  Contemplating pregnancy in the next year.  Past medical history, past surgical history, family history and social history were all reviewed and documented in the EPIC chart.  Works for [employer].  Mother recovering alcoholic.  Husband healthy.  ROS:  A ROS was performed and pertinent positives and negatives are included.  Exam:  Vitals:   12/15/19 1213  BP: 118/80   There is no height or weight on file to calculate BMI.   General appearance:  Normal Thyroid:  Symmetrical, normal in size, without palpable masses or nodularity. Respiratory  Auscultation:  Clear without wheezing or rhonchi Cardiovascular  Auscultation:  Regular rate, without rubs, murmurs or gallops  Edema/varicosities:  Not grossly evident Abdominal  Soft,nontender, without masses, guarding or rebound.  Liver/spleen:  No organomegaly noted  Hernia:  None appreciated  Skin  Inspection:  Grossly normal   Breasts: Examined lying and sitting.     Right: Without masses, retractions, discharge or axillary adenopathy.     Left: Without masses, retractions, discharge or axillary adenopathy. Gentitourinary   Inguinal/mons:  Normal without inguinal adenopathy  External genitalia:  Normal  BUS/Urethra/Skene's glands:  Normal  Vagina:  Normal  Cervix:  Normal IUD strings not visible  Uterus:  normal in size, shape and contour.  Midline and mobile  Adnexa/parametria:     Rt: Without masses or tenderness.   Lt: Without masses or tenderness.  Anus and perineum: Normal    Assessment/Plan:  29 y.o. MWF G0 for annual exam with no complaints of vaginal discharge, urinary symptoms, abdominal pain.  05/2016 Mirena IUD Lupus-rheumatologist at [facility] and meds  Plan:Marland Kitchen   Aware IUD is good for 5 years will return to office in August to have IUD removed and does plan to try to conceive.  Aware of safe pregnancy behaviors and stop methotrexate due to desiring pregnancy in the next year.  SBEs, exercise, calcium rich foods, continue prenatal vitamin daily.  Aware we no longer deliver.  Pap normal 2020, new screening guidelines reviewed.  Harrington Challenger [facility], 12:37 PM 12/15/2019

## 2019-12-15 NOTE — Patient Instructions (Signed)
Good to see you today   Health Maintenance, Female Adopting a healthy lifestyle and getting preventive care are important in promoting health and wellness. Ask your health care provider about:  The right schedule for you to have regular tests and exams.  Things you can do on your own to prevent diseases and keep yourself healthy. What should I know about diet, weight, and exercise? Eat a healthy diet   Eat a diet that includes plenty of vegetables, fruits, low-fat dairy products, and lean protein.  Do not eat a lot of foods that are high in solid fats, added sugars, or sodium. Maintain a healthy weight Body mass index (BMI) is used to identify weight problems. It estimates body fat based on height and weight. Your health care provider can help determine your BMI and help you achieve or maintain a healthy weight. Get regular exercise Get regular exercise. This is one of the most important things you can do for your health. Most adults should:  Exercise for at least 150 minutes each week. The exercise should increase your heart rate and make you sweat (moderate-intensity exercise).  Do strengthening exercises at least twice a week. This is in addition to the moderate-intensity exercise.  Spend less time sitting. Even light physical activity can be beneficial. Watch cholesterol and blood lipids Have your blood tested for lipids and cholesterol at 28 years of age, then have this test every 5 years. Have your cholesterol levels checked more often if:  Your lipid or cholesterol levels are high.  You are older than 28 years of age.  You are at high risk for heart disease. What should I know about cancer screening? Depending on your health history and family history, you may need to have cancer screening at various ages. This may include screening for:  Breast cancer.  Cervical cancer.  Colorectal cancer.  Skin cancer.  Lung cancer. What should I know about heart disease,  diabetes, and high blood pressure? Blood pressure and heart disease  High blood pressure causes heart disease and increases the risk of stroke. This is more likely to develop in people who have high blood pressure readings, are of African descent, or are overweight.  Have your blood pressure checked: ? Every 3-5 years if you are 18-39 years of age. ? Every year if you are 40 years old or older. Diabetes Have regular diabetes screenings. This checks your fasting blood sugar level. Have the screening done:  Once every three years after age 40 if you are at a normal weight and have a low risk for diabetes.  More often and at a younger age if you are overweight or have a high risk for diabetes. What should I know about preventing infection? Hepatitis B If you have a higher risk for hepatitis B, you should be screened for this virus. Talk with your health care provider to find out if you are at risk for hepatitis B infection. Hepatitis C Testing is recommended for:  Everyone born from 1945 through 1965.  Anyone with known risk factors for hepatitis C. Sexually transmitted infections (STIs)  Get screened for STIs, including gonorrhea and chlamydia, if: ? You are sexually active and are younger than 28 years of age. ? You are older than 28 years of age and your health care provider tells you that you are at risk for this type of infection. ? Your sexual activity has changed since you were last screened, and you are at increased risk for chlamydia or   gonorrhea. Ask your health care provider if you are at risk.  Ask your health care provider about whether you are at high risk for HIV. Your health care provider may recommend a prescription medicine to help prevent HIV infection. If you choose to take medicine to prevent HIV, you should first get tested for HIV. You should then be tested every 3 months for as long as you are taking the medicine. Pregnancy  If you are about to stop having your  period (premenopausal) and you may become pregnant, seek counseling before you get pregnant.  Take 400 to 800 micrograms (mcg) of folic acid every day if you become pregnant.  Ask for birth control (contraception) if you want to prevent pregnancy. Osteoporosis and menopause Osteoporosis is a disease in which the bones lose minerals and strength with aging. This can result in bone fractures. If you are 65 years old or older, or if you are at risk for osteoporosis and fractures, ask your health care provider if you should:  Be screened for bone loss.  Take a calcium or vitamin D supplement to lower your risk of fractures.  Be given hormone replacement therapy (HRT) to treat symptoms of menopause. Follow these instructions at home: Lifestyle  Do not use any products that contain nicotine or tobacco, such as cigarettes, e-cigarettes, and chewing tobacco. If you need help quitting, ask your health care provider.  Do not use street drugs.  Do not share needles.  Ask your health care provider for help if you need support or information about quitting drugs. Alcohol use  Do not drink alcohol if: ? Your health care provider tells you not to drink. ? You are pregnant, may be pregnant, or are planning to become pregnant.  If you drink alcohol: ? Limit how much you use to 0-1 drink a day. ? Limit intake if you are breastfeeding.  Be aware of how much alcohol is in your drink. In the [place] one drink equals one 12 oz bottle of beer (355 mL), one 5 oz glass of wine (148 mL), or one 1 oz glass of hard liquor (44 mL). General instructions  Schedule regular health, dental, and eye exams.  Stay current with your vaccines.  Tell your health care provider if: ? You often feel depressed. ? You have ever been abused or do not feel safe at home. Summary  Adopting a healthy lifestyle and getting preventive care are important in promoting health and wellness.  Follow your health care provider's  instructions about healthy diet, exercising, and getting tested or screened for diseases.  Follow your health care provider's instructions on monitoring your cholesterol and blood pressure. This information is not intended to replace advice given to you by your health care provider. Make sure you discuss any questions you have with your health care provider. Document Revised: 10/07/2018 Document Reviewed: 10/07/2018 Elsevier Patient Education  2020 Elsevier Inc.  

## 2020-03-21 ENCOUNTER — Ambulatory Visit: Payer: PRIVATE HEALTH INSURANCE | Admitting: Nurse Practitioner

## 2020-03-28 ENCOUNTER — Other Ambulatory Visit: Payer: Self-pay

## 2020-03-29 ENCOUNTER — Encounter: Payer: Self-pay | Admitting: Nurse Practitioner

## 2020-03-29 ENCOUNTER — Ambulatory Visit (INDEPENDENT_AMBULATORY_CARE_PROVIDER_SITE_OTHER): Payer: PRIVATE HEALTH INSURANCE | Admitting: Nurse Practitioner

## 2020-03-29 VITALS — BP 124/80

## 2020-03-29 DIAGNOSIS — Z30432 Encounter for removal of intrauterine contraceptive device: Secondary | ICD-10-CM | POA: Diagnosis not present

## 2020-03-29 NOTE — Progress Notes (Signed)
History: Patient here for IUD removal, placed 05/2016. Strings were trimmed 08/01/2016 due to pain after intercourse and have not been seen on subsequent exams. Plans to try to conceive.   Exam: Appears well External genitalia: normal Cervix: Normal, IUD strings not visible  Assessment: IUD removal  Plan: Intact IUD removed from endocervical canal, shown to patient, and discarded. She tolerated it well, given ibuprofen in office for mild cramping. Education provided on pregnancy-safe behaviors and prenatal vitamins. Will see a Lupus pregnancy specialist.

## 2020-07-10 ENCOUNTER — Ambulatory Visit (INDEPENDENT_AMBULATORY_CARE_PROVIDER_SITE_OTHER): Payer: PRIVATE HEALTH INSURANCE | Admitting: Nurse Practitioner

## 2020-07-10 ENCOUNTER — Encounter: Payer: Self-pay | Admitting: Nurse Practitioner

## 2020-07-10 ENCOUNTER — Other Ambulatory Visit: Payer: Self-pay

## 2020-07-10 VITALS — BP 118/80

## 2020-07-10 DIAGNOSIS — Z3201 Encounter for pregnancy test, result positive: Secondary | ICD-10-CM

## 2020-07-10 DIAGNOSIS — Z3A01 Less than 8 weeks gestation of pregnancy: Secondary | ICD-10-CM

## 2020-07-10 DIAGNOSIS — N912 Amenorrhea, unspecified: Secondary | ICD-10-CM

## 2020-07-10 LAB — PREGNANCY, URINE: Preg Test, Ur: POSITIVE — AB

## 2020-07-10 NOTE — Progress Notes (Signed)
   Acute Office Visit  Subjective:    Patient ID: Leslie Mccoy, female    DOB: 1991-08-24, 29 y.o.   MRN: [id number]   HPI 29 y.o. presents today for positive home pregnancy test. IUD removed 03/2020 due to desiring pregnancy. LMP 06/10/2020. She is currently 4 weeks 3 days gestation with EDD 03/17/2021. She is happy about pregnancy and has a supportive husband.    Review of Systems  Constitutional: Negative.   Gastrointestinal: Positive for nausea (intermittent).  Genitourinary: Negative.        Objective:    Physical Exam Constitutional:      Appearance: Normal appearance.     BP 118/80 (BP Location: Right Arm, Patient Position: Sitting, Cuff Size: Normal)   LMP 06/10/2020  Wt Readings from Last 3 Encounters:  11/09/18 151 lb (68.5 kg)  09/30/16 140 lb (63.5 kg)  05/29/16 138 lb 6.4 oz (62.8 kg)   UPT +     Assessment & Plan:   Problem List Items Addressed This Visit    None    Visit Diagnoses    Amenorrhea    -  Primary   Relevant Orders   Pregnancy, urine (Completed)   Less than [redacted] weeks gestation of pregnancy       Relevant Orders   US OB Transvaginal     Plan: Positive UPT today in office.  Written and verbal education provided on safe pregnancy practices. Continue prenatal vitamin. Her job requires lifting of 50+ pounds. It is recommended she does not lift over 30 and as her pregnancy progresses she may even need to lift less. Recommend small frequent meals and ginger-containing foods for management of nauea. She sees Rheumatology for management of Lupus. She discussed her current lupus medications with them and it is OK for her to continue these. She plans to call a Lupus Pregnancy Specialist to establish care. We will schedule a viability ultrasound at 8 weeks and will send records to new OB provider. Instructed to return to office or go to ED if she experiences abdominal pain with bleeding. She is agreeable to plan.      Olivia Mackie [facility], 3:01 PM  07/10/2020

## 2020-07-10 NOTE — Patient Instructions (Signed)
First Trimester of Pregnancy  The first trimester of pregnancy is from week 1 until the end of week 13 (months 1 through 3). During this time, your baby will begin to develop inside you. At 6-8 weeks, the eyes and face are formed, and the heartbeat can be seen on ultrasound. At the end of 12 weeks, all the baby's organs are formed. Prenatal care is all the medical care you receive before the birth of your baby. Make sure you get good prenatal care and follow all of your doctor's instructions. Follow these instructions at home: Medicines  Take over-the-counter and prescription medicines only as told by your doctor. Some medicines are safe and some medicines are not safe during pregnancy.  Take a prenatal vitamin that contains at least 600 micrograms (mcg) of folic acid.  If you have trouble pooping (constipation), take medicine that will make your stool soft (stool softener) if your doctor approves. Eating and drinking   Eat regular, healthy meals.  Your doctor will tell you the amount of weight gain that is right for you.  Avoid raw meat and uncooked cheese.  If you feel sick to your stomach (nauseous) or throw up (vomit): ? Eat 4 or 5 small meals a day instead of 3 large meals. ? Try eating a few soda crackers. ? Drink liquids between meals instead of during meals.  To prevent constipation: ? Eat foods that are high in fiber, like fresh fruits and vegetables, whole grains, and beans. ? Drink enough fluids to keep your pee (urine) clear or pale yellow. Activity  Exercise only as told by your doctor. Stop exercising if you have cramps or pain in your lower belly (abdomen) or low back.  Do not exercise if it is too hot, too humid, or if you are in a place of great height (high altitude).  Try to avoid standing for long periods of time. Move your legs often if you must stand in one place for a long time.  Avoid heavy lifting.  Wear low-heeled shoes. Sit and stand up  straight.  You can have sex unless your doctor tells you not to. Relieving pain and discomfort  Wear a good support bra if your breasts are sore.  Take warm water baths (sitz baths) to soothe pain or discomfort caused by hemorrhoids. Use hemorrhoid cream if your doctor says it is okay.  Rest with your legs raised if you have leg cramps or low back pain.  If you have puffy, bulging veins (varicose veins) in your legs: ? Wear support hose or compression stockings as told by your doctor. ? Raise (elevate) your feet for 15 minutes, 3-4 times a day. ? Limit salt in your food. Prenatal care  Schedule your prenatal visits by the twelfth week of pregnancy.  Write down your questions. Take them to your prenatal visits.  Keep all your prenatal visits as told by your doctor. This is important. Safety  Wear your seat belt at all times when driving.  Make a list of emergency phone numbers. The list should include numbers for family, friends, the hospital, and police and fire departments. General instructions  Ask your doctor for a referral to a local prenatal class. Begin classes no later than at the start of month 6 of your pregnancy.  Ask for help if you need counseling or if you need help with nutrition. Your doctor can give you advice or tell you where to go for help.  Do not use hot tubs, steam   rooms, or saunas.  Do not douche or use tampons or scented sanitary pads.  Do not cross your legs for long periods of time.  Avoid all herbs and alcohol. Avoid drugs that are not approved by your doctor.  Do not use any tobacco products, including cigarettes, chewing tobacco, and electronic cigarettes. If you need help quitting, ask your doctor. You may get counseling or other support to help you quit.  Avoid cat litter boxes and soil used by cats. These carry germs that can cause birth defects in the baby and can cause a loss of your baby (miscarriage) or stillbirth.  Visit your dentist.  At home, brush your teeth with a soft toothbrush. Be gentle when you floss. Contact a doctor if:  You are dizzy.  You have mild cramps or pressure in your lower belly.  You have a nagging pain in your belly area.  You continue to feel sick to your stomach, you throw up, or you have watery poop (diarrhea).  You have a bad smelling fluid coming from your vagina.  You have pain when you pee (urinate).  You have increased puffiness (swelling) in your face, hands, legs, or ankles. Get help right away if:  You have a fever.  You are leaking fluid from your vagina.  You have spotting or bleeding from your vagina.  You have very bad belly cramping or pain.  You gain or lose weight rapidly.  You throw up blood. It may look like coffee grounds.  You are around people who have German measles, fifth disease, or chickenpox.  You have a very bad headache.  You have shortness of breath.  You have any kind of trauma, such as from a fall or a car accident. Summary  The first trimester of pregnancy is from week 1 until the end of week 13 (months 1 through 3).  To take care of yourself and your unborn baby, you will need to eat healthy meals, take medicines only if your doctor tells you to do so, and do activities that are safe for you and your baby.  Keep all follow-up visits as told by your doctor. This is important as your doctor will have to ensure that your baby is healthy and growing well. This information is not intended to replace advice given to you by your health care provider. Make sure you discuss any questions you have with your health care provider. Document Revised: 02/04/2019 Document Reviewed: 10/22/2016 Elsevier Patient Education  2020 Elsevier Inc.  

## 2020-07-12 ENCOUNTER — Ambulatory Visit: Payer: PRIVATE HEALTH INSURANCE | Admitting: Nurse Practitioner

## 2020-08-03 ENCOUNTER — Other Ambulatory Visit: Payer: Self-pay

## 2020-08-03 ENCOUNTER — Ambulatory Visit (INDEPENDENT_AMBULATORY_CARE_PROVIDER_SITE_OTHER): Payer: PRIVATE HEALTH INSURANCE

## 2020-08-03 ENCOUNTER — Ambulatory Visit: Payer: PRIVATE HEALTH INSURANCE | Admitting: Nurse Practitioner

## 2020-08-03 VITALS — BP 122/80

## 2020-08-03 DIAGNOSIS — Z3A01 Less than 8 weeks gestation of pregnancy: Secondary | ICD-10-CM | POA: Diagnosis not present

## 2020-08-03 DIAGNOSIS — N854 Malposition of uterus: Secondary | ICD-10-CM | POA: Diagnosis not present

## 2020-08-03 DIAGNOSIS — O3680X1 Pregnancy with inconclusive fetal viability, fetus 1: Secondary | ICD-10-CM

## 2020-08-03 DIAGNOSIS — Z3A08 8 weeks gestation of pregnancy: Secondary | ICD-10-CM

## 2020-08-03 NOTE — Progress Notes (Signed)
History: 29 year old presents today for viability ultrasound follow-up.  LMP 06/10/2020. Currently [redacted]w[redacted]d with EDD 03/15/2021. Complains of constant nausea. Husband present during visit.  All questions answered.  Exam: Appears well Ultrasound: Anteverted uterus, single viable IUP, CRL consistent with LMP dates.  FHR = 163 b/min -normal for gestational age.  Normal yolk sac.  Cervix long and closed.  Adnexa within normal limits with resolving corpus luteum on left side.  No free fluid.  Assessment: [redacted] weeks gestation of pregnancy  Plan: Continue prenatal vitamin.  Rheumatologist confirmed with patient that it was okay to continue her lupus medications. She has an appointment at the end of this month with a lupus pregnancy specialist.  Recommended eating every 1-2 hours to avoid an empty stomach and ginger-containing foods/drinks for management of nausea.  Discussed options for nausea medicine and she will let me know if she would like to try these.  Again we discussed safe pregnancy behaviors.

## 2020-08-16 ENCOUNTER — Telehealth: Payer: Self-pay | Admitting: *Deleted

## 2020-08-16 NOTE — Telephone Encounter (Signed)
Patient called about [redacted] weeks pregnant c/o having issues with keeping her prenatal vitamin down. Has tried gummy prenatal and pill form and had vomiting with both. Report she feels nauseated, but doesn't feel the need for medication at anytime. She asked if you have any recommendations of other type of prenatal pill to help? Please advise

## 2020-08-16 NOTE — Telephone Encounter (Signed)
Typically if one type/brand makes her sick others will too. If she hasn't tried already I would take at bedtime or at a point during the day while eating. Do not take on empty stomach. If she is having nausea and/or vomiting most of the day that is making it difficult for her to keep food/medication down we can try a medication for pregnancy-induced nausea. Let me know her thoughts.

## 2020-08-16 NOTE — Telephone Encounter (Signed)
Patient informed with below note. 

## 2021-04-11 ENCOUNTER — Other Ambulatory Visit: Payer: Self-pay

## 2021-04-11 ENCOUNTER — Telehealth: Payer: Self-pay

## 2021-04-11 DIAGNOSIS — Z30430 Encounter for insertion of intrauterine contraceptive device: Secondary | ICD-10-CM

## 2021-04-11 NOTE — Telephone Encounter (Signed)
She does not need to be seen prior to IUD insertion since I have seen her before and she has also had an IUD in the past. Thank you for checking and I double-checked with Dr. Seymour Bars as well.

## 2021-04-11 NOTE — Telephone Encounter (Signed)
Front desk scheduled patient for insertion of IUD and asked me to place order.  I advised that she needs to have an office visit first. They relayed to me "that she just had a baby".  I do see her postpartum visit from yesterday with Perinatology at [facility].  I still told appt desk to schedule her an RG visit to come see you first for an order but then second guessed myself if she actually needed to come in prior to insertion.  Please advise.

## 2021-04-11 NOTE — Telephone Encounter (Signed)
Leslie Mccoy at appt desk informed so she can schedule patient for IUD insertion. Order placed.

## 2021-04-18 ENCOUNTER — Ambulatory Visit: Payer: No Typology Code available for payment source | Admitting: Obstetrics and Gynecology

## 2022-03-20 DIAGNOSIS — Z7960 Long term (current) use of unspecified immunomodulators and immunosuppressants: Secondary | ICD-10-CM | POA: Diagnosis not present

## 2022-03-20 DIAGNOSIS — M329 Systemic lupus erythematosus, unspecified: Secondary | ICD-10-CM | POA: Diagnosis not present

## 2022-03-20 DIAGNOSIS — E538 Deficiency of other specified B group vitamins: Secondary | ICD-10-CM | POA: Diagnosis not present

## 2022-05-14 ENCOUNTER — Other Ambulatory Visit (HOSPITAL_COMMUNITY)
Admission: RE | Admit: 2022-05-14 | Discharge: 2022-05-14 | Disposition: A | Discharge status: Home / Self Care | Payer: 59 | Source: Ambulatory Visit | Attending: Nurse Practitioner | Admitting: Nurse Practitioner

## 2022-05-14 ENCOUNTER — Encounter: Payer: Self-pay | Admitting: Nurse Practitioner

## 2022-05-14 ENCOUNTER — Ambulatory Visit (INDEPENDENT_AMBULATORY_CARE_PROVIDER_SITE_OTHER): Payer: 59 | Admitting: Nurse Practitioner

## 2022-05-14 VITALS — BP 126/74 | HR 62 | Resp 12 | Ht 65.0 in | Wt 163.0 lb

## 2022-05-14 DIAGNOSIS — Z30431 Encounter for routine checking of intrauterine contraceptive device: Secondary | ICD-10-CM | POA: Diagnosis not present

## 2022-05-14 DIAGNOSIS — Z01419 Encounter for gynecological examination (general) (routine) without abnormal findings: Secondary | ICD-10-CM

## 2022-05-14 NOTE — Progress Notes (Signed)
   Leslie Mccoy 1991-05-12 [id number]   History:  31 y.o. G1P1001 presents for annual exam. Mirena IUD 03/2021. Light monthly cycles. Normal pap history. H/O lupus, depression.   Gynecologic History Patient's last menstrual period was 04/27/2022. Period Cycle (Days): 30 Period Duration (Days): 3 Menstrual Flow: Light Dysmenorrhea: None Contraception/Family planning: IUD Sexually active: Yes  Health Maintenance Last Pap: 11/09/2018. Results were: Normal Last mammogram: Not indicated Last colonoscopy: Not indicated Last Dexa: Not indicated  Past medical history, past surgical history, family history and social history were all reviewed and documented in the EPIC chart. Married. Works for [employer]. 1 yo daughter, [name].   ROS:  A ROS was performed and pertinent positives and negatives are included.  Exam:  Vitals:   05/14/22 1020  BP: 126/74  Pulse: 62  Resp: 12  Weight: 163 lb (73.9 kg)  Height: 5\' 5"  (1.651 m)   Body mass index is 27.12 kg/m.  General appearance:  Normal Thyroid:  Symmetrical, normal in size, without palpable masses or nodularity. Respiratory  Auscultation:  Clear without wheezing or rhonchi Cardiovascular  Auscultation:  Regular rate, without rubs, murmurs or gallops  Edema/varicosities:  Not grossly evident Abdominal  Soft,nontender, without masses, guarding or rebound.  Liver/spleen:  No organomegaly noted  Hernia:  None appreciated  Skin  Inspection:  Grossly normal Breasts: Examined lying and sitting.   Right: Without masses, retractions, nipple discharge or axillary adenopathy.   Left: Without masses, retractions, nipple discharge or axillary adenopathy. Genitourinary   Inguinal/mons:  Normal without inguinal adenopathy  External genitalia:  Normal appearing vulva with no masses, tenderness, or lesions  BUS/Urethra/Skene's glands:  Normal  Vagina:  Normal appearing with normal color and discharge, no lesions  Cervix:  Normal  appearing without discharge or lesions. IUD string visible  Uterus:  Normal in size, shape and contour.  Midline and mobile, nontender  Adnexa/parametria:     Rt: Normal in size, without masses or tenderness.   Lt: Normal in size, without masses or tenderness.  Anus and perineum: Normal  Digital rectal exam: Not indicated  Patient informed chaperone available to be present for breast and pelvic exam. Patient has requested no chaperone to be present. Patient has been advised what will be completed during breast and pelvic exam.   Assessment/Plan:  31 y.o. G1P1001 for annual exam.   Well female exam with routine gynecological exam - Plan: Cytology - PAP( [facility]). Education provided on SBEs, importance of preventative screenings, current guidelines, high calcium diet, regular exercise, and multivitamin daily.  Labs done by PCP and rheumatology.   Encounter for routine checking of intrauterine contraceptive device (IUD) - Mirena IUD 03/2021. Light monthly cycles. Aware of 8-year FDA approval.   Screening for cervical cancer - Normal Pap history.  Pap today.   Return in 1 year for annual.     04/2021 DNP, 10:44 AM 05/14/2022

## 2022-05-16 LAB — CYTOLOGY - PAP
Comment: NEGATIVE
Diagnosis: NEGATIVE
High risk HPV: NEGATIVE

## 2022-06-27 DIAGNOSIS — L710 Perioral dermatitis: Secondary | ICD-10-CM | POA: Diagnosis not present

## 2022-08-20 DIAGNOSIS — J029 Acute pharyngitis, unspecified: Secondary | ICD-10-CM | POA: Diagnosis not present

## 2022-08-20 DIAGNOSIS — J069 Acute upper respiratory infection, unspecified: Secondary | ICD-10-CM | POA: Diagnosis not present

## 2022-08-23 DIAGNOSIS — R69 Illness, unspecified: Secondary | ICD-10-CM | POA: Diagnosis not present

## 2022-08-23 DIAGNOSIS — R7989 Other specified abnormal findings of blood chemistry: Secondary | ICD-10-CM | POA: Diagnosis not present

## 2022-08-29 DIAGNOSIS — D224 Melanocytic nevi of scalp and neck: Secondary | ICD-10-CM | POA: Diagnosis not present

## 2022-08-29 DIAGNOSIS — L710 Perioral dermatitis: Secondary | ICD-10-CM | POA: Diagnosis not present

## 2022-08-29 DIAGNOSIS — D485 Neoplasm of uncertain behavior of skin: Secondary | ICD-10-CM | POA: Diagnosis not present

## 2022-08-29 DIAGNOSIS — D2271 Melanocytic nevi of right lower limb, including hip: Secondary | ICD-10-CM | POA: Diagnosis not present

## 2022-08-29 DIAGNOSIS — D2272 Melanocytic nevi of left lower limb, including hip: Secondary | ICD-10-CM | POA: Diagnosis not present

## 2022-08-29 DIAGNOSIS — D1801 Hemangioma of skin and subcutaneous tissue: Secondary | ICD-10-CM | POA: Diagnosis not present

## 2022-08-29 DIAGNOSIS — D2261 Melanocytic nevi of right upper limb, including shoulder: Secondary | ICD-10-CM | POA: Diagnosis not present

## 2022-08-29 DIAGNOSIS — D225 Melanocytic nevi of trunk: Secondary | ICD-10-CM | POA: Diagnosis not present

## 2022-08-29 DIAGNOSIS — D2262 Melanocytic nevi of left upper limb, including shoulder: Secondary | ICD-10-CM | POA: Diagnosis not present

## 2022-10-01 ENCOUNTER — Ambulatory Visit: Payer: 59 | Admitting: Nurse Practitioner

## 2022-10-01 ENCOUNTER — Encounter: Payer: Self-pay | Admitting: Nurse Practitioner

## 2022-10-01 VITALS — BP 102/70 | HR 67

## 2022-10-01 DIAGNOSIS — N3000 Acute cystitis without hematuria: Secondary | ICD-10-CM | POA: Diagnosis not present

## 2022-10-01 DIAGNOSIS — R300 Dysuria: Secondary | ICD-10-CM

## 2022-10-01 DIAGNOSIS — R350 Frequency of micturition: Secondary | ICD-10-CM | POA: Diagnosis not present

## 2022-10-01 MED ORDER — SULFAMETHOXAZOLE-TRIMETHOPRIM 800-160 MG PO TABS: 1.0000 | ORAL_TABLET | Freq: Two times a day (BID) | ORAL | 0 refills | Status: DC

## 2022-10-01 NOTE — Progress Notes (Signed)
   Acute Office Visit  Subjective:    Patient ID: Leslie Mccoy, female    DOB: 1991-10-04, 31 y.o.   MRN: [id number]   HPI 31 y.o. presents today for urinary frequency, urgency, dysuria x 2 days.    Review of Systems  Constitutional: Negative.   Genitourinary:  Positive for dysuria, frequency and urgency. Negative for difficulty urinating, flank pain and hematuria.       Objective:    Physical Exam Constitutional:      Appearance: Normal appearance.   GU: Not indicated  BP 102/70   Pulse 67   SpO2 98%  Wt Readings from Last 3 Encounters:  05/14/22 163 lb (73.9 kg)  11/09/18 151 lb (68.5 kg)  09/30/16 140 lb (63.5 kg)        UA: 1+ leukocytes, negative nitrites, trace blood, protein 1+, yellow/cloudy. Microscopic: wbc 20-40, rbc 0-2, moderate bacteria  Assessment & Plan:   Problem List Items Addressed This Visit   None Visit Diagnoses     Acute cystitis without hematuria    -  Primary   Relevant Medications   sulfamethoxazole-trimethoprim (BACTRIM DS) 800-160 MG tablet   Dysuria       Relevant Orders   Urinalysis,Complete w/RFL Culture      Plan: Acute cystitis - Bactrim 800-160 mg BID x 3 days. Culture pending.      Olivia Mackie DNP, 4:06 PM 10/01/2022

## 2022-10-04 ENCOUNTER — Telehealth: Payer: Self-pay | Admitting: *Deleted

## 2022-10-04 LAB — URINE CULTURE
MICRO NUMBER:: 14272154
SPECIMEN QUALITY:: ADEQUATE

## 2022-10-04 LAB — URINALYSIS, COMPLETE W/RFL CULTURE
Bilirubin Urine: NEGATIVE
Casts: NONE SEEN /LPF
Crystals: NONE SEEN /HPF
Glucose, UA: NEGATIVE
Hyaline Cast: NONE SEEN /LPF
Ketones, ur: NEGATIVE
Nitrites, Initial: NEGATIVE
Specific Gravity, Urine: 1.025 (ref 1.001–1.035)
Yeast: NONE SEEN /HPF
pH: 6 (ref 5.0–8.0)

## 2022-10-04 LAB — CULTURE INDICATED

## 2022-10-04 MED ORDER — NITROFURANTOIN MONOHYD MACRO 100 MG PO CAPS: 100.0000 mg | ORAL_CAPSULE | Freq: Two times a day (BID) | ORAL | 0 refills | Status: DC

## 2022-10-04 NOTE — Telephone Encounter (Signed)
Urine culture showed resistance to Bactrim, may switch to Macrobid po BID x 5 days

## 2022-10-04 NOTE — Telephone Encounter (Signed)
Patient saw Tiffany on 10/01/22 for UTI was prescribed bactrim bid x 3 days, reports she took the last pill this am, she woke up at 4 am with frequent urination and burning and noticed blood in urine this am, she is not on her cycle. She asked if another should be sent into the pharmacy? Please advise

## 2022-10-04 NOTE — Telephone Encounter (Signed)
Patient informed. Rx sent 

## 2023-01-15 DIAGNOSIS — M329 Systemic lupus erythematosus, unspecified: Secondary | ICD-10-CM | POA: Diagnosis not present

## 2023-01-15 DIAGNOSIS — Z7960 Long term (current) use of unspecified immunomodulators and immunosuppressants: Secondary | ICD-10-CM | POA: Diagnosis not present

## 2023-05-19 ENCOUNTER — Ambulatory Visit (INDEPENDENT_AMBULATORY_CARE_PROVIDER_SITE_OTHER): Payer: 59 | Admitting: Nurse Practitioner

## 2023-05-19 VITALS — BP 106/68 | HR 79 | Ht 65.0 in | Wt 172.0 lb

## 2023-05-19 DIAGNOSIS — Z30431 Encounter for routine checking of intrauterine contraceptive device: Secondary | ICD-10-CM | POA: Diagnosis not present

## 2023-05-19 DIAGNOSIS — Z01419 Encounter for gynecological examination (general) (routine) without abnormal findings: Secondary | ICD-10-CM

## 2023-05-19 NOTE — Progress Notes (Signed)
   Leslie Mccoy 04-17-1991 [id number]   History:  32 y.o. G1P1001 presents for annual exam. Mirena IUD 03/2021. Light monthly cycles. Normal pap history. H/O lupus, depression.   Gynecologic History Patient's last menstrual period was 05/05/2023. Period Cycle (Days): 28 Period Duration (Days): 2-3 Period Pattern: Regular Menstrual Flow: Light Menstrual Control: Tampon Menstrual Control Change Freq (Hours): 6 Dysmenorrhea: None Contraception/Family planning: IUD Sexually active: Yes  Health Maintenance Last Pap: 05/14/2022. Results were: Normal neg HPV, 5-year repeat Last mammogram: Not indicated Last colonoscopy: Not indicated Last Dexa: Not indicated  Past medical history, past surgical history, family history and social history were all reviewed and documented in the EPIC chart. Married. Works for [employer]. 2 yo daughter, [name].   ROS:  A ROS was performed and pertinent positives and negatives are included.  Exam:  Vitals:   05/19/23 1014  BP: 106/68  Pulse: 79  SpO2: 100%  Weight: 172 lb (78 kg)  Height: 5\' 5"  (1.651 m)    Body mass index is 28.62 kg/m.  General appearance:  Normal Thyroid:  Symmetrical, normal in size, without palpable masses or nodularity. Respiratory  Auscultation:  Clear without wheezing or rhonchi Cardiovascular  Auscultation:  Regular rate, without rubs, murmurs or gallops  Edema/varicosities:  Not grossly evident Abdominal  Soft,nontender, without masses, guarding or rebound.  Liver/spleen:  No organomegaly noted  Hernia:  None appreciated  Skin  Inspection:  Grossly normal Breasts: Examined lying and sitting.   Right: Without masses, retractions, nipple discharge or axillary adenopathy.   Left: Without masses, retractions, nipple discharge or axillary adenopathy. Genitourinary   Inguinal/mons:  Normal without inguinal adenopathy  External genitalia:  Normal appearing vulva with no masses, tenderness, or  lesions  BUS/Urethra/Skene's glands:  Normal  Vagina:  Normal appearing with normal color and discharge, no lesions  Cervix:  Normal appearing without discharge or lesions. IUD string visible just inside cervical os  Uterus:  Normal in size, shape and contour.  Midline and mobile, nontender  Adnexa/parametria:     Rt: Normal in size, without masses or tenderness.   Lt: Normal in size, without masses or tenderness.  Anus and perineum: Normal  Digital rectal exam: Not indicated  Patient informed chaperone available to be present for breast and pelvic exam. Patient has requested no chaperone to be present. Patient has been advised what will be completed during breast and pelvic exam.   Assessment/Plan:  32 y.o. G1P1001 for annual exam.   Well female exam with routine gynecological exam - Education provided on SBEs, importance of preventative screenings, current guidelines, high calcium diet, regular exercise, and multivitamin daily.  Labs done by PCP and rheumatology.   Encounter for routine checking of intrauterine contraceptive device (IUD) - Mirena IUD 03/2021. Light monthly cycles. Aware of 8-year FDA approval.   Screening for cervical cancer - Normal Pap history. Will repeat at 5-year interval per guidelines.   Return in 1 year for annual.     Olivia Mackie DNP, 10:26 AM 05/19/2023

## 2023-08-27 DIAGNOSIS — Z1389 Encounter for screening for other disorder: Secondary | ICD-10-CM | POA: Diagnosis not present

## 2023-09-01 DIAGNOSIS — D2271 Melanocytic nevi of right lower limb, including hip: Secondary | ICD-10-CM | POA: Diagnosis not present

## 2023-09-01 DIAGNOSIS — D2261 Melanocytic nevi of right upper limb, including shoulder: Secondary | ICD-10-CM | POA: Diagnosis not present

## 2023-09-01 DIAGNOSIS — D224 Melanocytic nevi of scalp and neck: Secondary | ICD-10-CM | POA: Diagnosis not present

## 2023-09-01 DIAGNOSIS — D2262 Melanocytic nevi of left upper limb, including shoulder: Secondary | ICD-10-CM | POA: Diagnosis not present

## 2023-09-01 DIAGNOSIS — D225 Melanocytic nevi of trunk: Secondary | ICD-10-CM | POA: Diagnosis not present

## 2023-09-01 DIAGNOSIS — Q833 Accessory nipple: Secondary | ICD-10-CM | POA: Diagnosis not present

## 2023-09-01 DIAGNOSIS — D2272 Melanocytic nevi of left lower limb, including hip: Secondary | ICD-10-CM | POA: Diagnosis not present

## 2023-09-05 DIAGNOSIS — D84821 Immunodeficiency due to drugs: Secondary | ICD-10-CM | POA: Diagnosis not present

## 2023-09-05 DIAGNOSIS — F419 Anxiety disorder, unspecified: Secondary | ICD-10-CM | POA: Diagnosis not present

## 2023-09-05 DIAGNOSIS — Z0000 Encounter for general adult medical examination without abnormal findings: Secondary | ICD-10-CM | POA: Diagnosis not present

## 2023-09-05 DIAGNOSIS — M3210 Systemic lupus erythematosus, organ or system involvement unspecified: Secondary | ICD-10-CM | POA: Diagnosis not present

## 2023-09-05 DIAGNOSIS — J302 Other seasonal allergic rhinitis: Secondary | ICD-10-CM | POA: Diagnosis not present

## 2023-09-05 DIAGNOSIS — F330 Major depressive disorder, recurrent, mild: Secondary | ICD-10-CM | POA: Diagnosis not present

## 2023-09-24 DIAGNOSIS — D84821 Immunodeficiency due to drugs: Secondary | ICD-10-CM | POA: Diagnosis not present

## 2023-09-24 DIAGNOSIS — J069 Acute upper respiratory infection, unspecified: Secondary | ICD-10-CM | POA: Diagnosis not present

## 2023-09-24 DIAGNOSIS — J302 Other seasonal allergic rhinitis: Secondary | ICD-10-CM | POA: Diagnosis not present

## 2023-09-24 DIAGNOSIS — M3210 Systemic lupus erythematosus, organ or system involvement unspecified: Secondary | ICD-10-CM | POA: Diagnosis not present

## 2023-09-24 DIAGNOSIS — R051 Acute cough: Secondary | ICD-10-CM | POA: Diagnosis not present

## 2023-11-19 DIAGNOSIS — M329 Systemic lupus erythematosus, unspecified: Secondary | ICD-10-CM | POA: Diagnosis not present

## 2024-01-14 ENCOUNTER — Encounter: Payer: Self-pay | Admitting: Nurse Practitioner

## 2024-01-14 ENCOUNTER — Ambulatory Visit: Admitting: Nurse Practitioner

## 2024-01-14 VITALS — BP 138/76 | HR 74 | Resp 14 | Ht 65.0 in | Wt 174.0 lb

## 2024-01-14 DIAGNOSIS — B3731 Acute candidiasis of vulva and vagina: Secondary | ICD-10-CM | POA: Diagnosis not present

## 2024-01-14 DIAGNOSIS — N898 Other specified noninflammatory disorders of vagina: Secondary | ICD-10-CM

## 2024-01-14 LAB — WET PREP FOR TRICH, YEAST, CLUE

## 2024-01-14 MED ORDER — FLUCONAZOLE 150 MG PO TABS: 150.0000 mg | ORAL_TABLET | ORAL | 0 refills | Status: DC

## 2024-01-14 NOTE — Progress Notes (Signed)
   Acute Office Visit  Subjective:    Patient ID: Leslie Mccoy, female    DOB: 1991-03-30, 33 y.o.   MRN: [id number]   HPI 33 y.o. presents today for vaginal itching/irritation and discharge x 2-3 days. Mostly notices it with wiping.  Patient's last menstrual period was 12/27/2023.    Review of Systems  Constitutional: Negative.   Genitourinary:  Positive for vaginal discharge and vaginal pain.       Objective:    Physical Exam Constitutional:      Appearance: Normal appearance.  Genitourinary:    General: Normal vulva.     Vagina: Vaginal discharge and erythema present.     Cervix: Normal.     BP 138/76   Pulse 74   Resp 14   Ht 5\' 5"  (1.651 m)   Wt 174 lb (78.9 kg)   LMP 12/27/2023   BMI 28.96 kg/m  Wt Readings from Last 3 Encounters:  01/14/24 174 lb (78.9 kg)  05/19/23 172 lb (78 kg)  05/14/22 163 lb (73.9 kg)        Patient informed chaperone available to be present for breast and/or pelvic exam. Patient has requested no chaperone to be present. Patient has been advised what will be completed during breast and pelvic exam.   Wet prep + yeast  Assessment & Plan:   Problem List Items Addressed This Visit   None Visit Diagnoses       Vaginal candidiasis    -  Primary   Relevant Medications   fluconazole (DIFLUCAN) 150 MG tablet     Vaginal irritation       Relevant Orders   WET PREP FOR TRICH, YEAST, CLUE      Plan: Wet prep positive for yeast - Diflucan 150 mg every 3 days for 2 doses.   Return if symptoms worsen or fail to improve.    Olivia Mackie DNP, 1:57 PM 01/14/2024

## 2024-02-23 ENCOUNTER — Ambulatory Visit: Admitting: Nurse Practitioner

## 2024-02-23 ENCOUNTER — Encounter: Payer: Self-pay | Admitting: Nurse Practitioner

## 2024-02-23 ENCOUNTER — Telehealth: Payer: Self-pay

## 2024-02-23 VITALS — BP 114/78 | HR 83

## 2024-02-23 DIAGNOSIS — N3000 Acute cystitis without hematuria: Secondary | ICD-10-CM

## 2024-02-23 DIAGNOSIS — R300 Dysuria: Secondary | ICD-10-CM

## 2024-02-23 MED ORDER — FLUCONAZOLE 150 MG PO TABS
150.0000 mg | ORAL_TABLET | ORAL | 0 refills | Status: DC
Start: 2024-02-23 — End: 2024-07-05

## 2024-02-23 MED ORDER — NITROFURANTOIN MONOHYD MACRO 100 MG PO CAPS
100.0000 mg | ORAL_CAPSULE | Freq: Two times a day (BID) | ORAL | 0 refills | Status: DC
Start: 2024-02-23 — End: 2024-08-18

## 2024-02-23 NOTE — Telephone Encounter (Signed)
 Patient called stating that she is having UTI symptoms. She does not want to come in because she does not have the money. Patient is asking if she can just have an antibiotic. Please advise.

## 2024-02-23 NOTE — Telephone Encounter (Signed)
 Needs to be seen to collect urine culture. Can also do urgent care.

## 2024-02-23 NOTE — Progress Notes (Signed)
   Acute Office Visit  Subjective:    Patient ID: Leslie Mccoy, female    DOB: [birth year], 33 y.o.   MRN: [id number]   HPI 33 y.o. presents today for burning with urination and urinary frequency since yesterday.   Patient's last menstrual period was 02/23/2024 (exact date). Period Cycle (Days): 28 Period Duration (Days): 2-4 Period Pattern: Regular Menstrual Flow: Light Menstrual Control: Maxi pad, Tampon Dysmenorrhea: None  Review of Systems  Constitutional: Negative.   Genitourinary:  Positive for dysuria, frequency and urgency. Negative for flank pain and pelvic pain.       Objective:    Physical Exam Constitutional:      Appearance: Normal appearance.  Abdominal:     Tenderness: There is no right CVA tenderness or left CVA tenderness.     BP 114/78 (BP Location: Left Arm, Patient Position: Sitting, Cuff Size: Normal)   Pulse 83   LMP 02/23/2024 (Exact Date)   SpO2 98%  Wt Readings from Last 3 Encounters:  01/14/24 174 lb (78.9 kg)  05/19/23 172 lb (78 kg)  05/14/22 163 lb (73.9 kg)        UA: 2+ leukocytes, neg nitrites, 2+ blood (on menses), dark yellow/cloudy. Microscopic: wbc 20-40, rbc 10-20, moderate bacteria  Assessment & Plan:   Problem List Items Addressed This Visit   None Visit Diagnoses       Acute cystitis without hematuria    -  Primary   Relevant Medications   nitrofurantoin , macrocrystal-monohydrate, (MACROBID ) 100 MG capsule   fluconazole  (DIFLUCAN ) 150 MG tablet     Dysuria       Relevant Orders   Urinalysis,Complete w/RFL Culture      Plan: Acute cystitis - Macrobid  100 mg BID x 7 days. Increase water intake. Declines pyridium. Diflucan  for yeast prevention.   Return if symptoms worsen or fail to improve.    Andee Bamberger DNP, 2:26 PM 02/23/2024

## 2024-02-23 NOTE — Telephone Encounter (Signed)
 Patient scheduled for 1:45pm visit with tw.

## 2024-02-25 ENCOUNTER — Encounter: Payer: Self-pay | Admitting: Nurse Practitioner

## 2024-02-25 LAB — URINE CULTURE
MICRO NUMBER:: 16388357
SPECIMEN QUALITY:: ADEQUATE

## 2024-02-25 LAB — URINALYSIS, COMPLETE W/RFL CULTURE
Bilirubin Urine: NEGATIVE
Glucose, UA: NEGATIVE
Hyaline Cast: NONE SEEN /LPF
Ketones, ur: NEGATIVE
Nitrites, Initial: NEGATIVE
Specific Gravity, Urine: 1.017 (ref 1.001–1.035)
pH: 6 (ref 5.0–8.0)

## 2024-02-25 LAB — CULTURE INDICATED

## 2024-05-19 ENCOUNTER — Ambulatory Visit: Payer: 59 | Admitting: Nurse Practitioner

## 2024-07-05 ENCOUNTER — Ambulatory Visit (INDEPENDENT_AMBULATORY_CARE_PROVIDER_SITE_OTHER): Admitting: Nurse Practitioner

## 2024-07-05 ENCOUNTER — Encounter: Payer: Self-pay | Admitting: Nurse Practitioner

## 2024-07-05 VITALS — BP 112/78 | HR 71 | Ht 65.0 in | Wt 172.0 lb

## 2024-07-05 DIAGNOSIS — Z01419 Encounter for gynecological examination (general) (routine) without abnormal findings: Secondary | ICD-10-CM | POA: Diagnosis not present

## 2024-07-05 DIAGNOSIS — I499 Cardiac arrhythmia, unspecified: Secondary | ICD-10-CM | POA: Diagnosis not present

## 2024-07-05 DIAGNOSIS — Z30431 Encounter for routine checking of intrauterine contraceptive device: Secondary | ICD-10-CM

## 2024-07-05 DIAGNOSIS — Z1331 Encounter for screening for depression: Secondary | ICD-10-CM

## 2024-07-05 NOTE — Progress Notes (Signed)
 Leslie Mccoy [birth year] [id number]   History:  33 y.o. G1P1001 presents for annual exam. Mirena  IUD 03/2021. Light monthly cycles. Normal pap history. H/O lupus, depression.   Gynecologic History Patient's last menstrual period was 07/04/2024 (exact date). Period Cycle (Days): 28 Period Duration (Days): 2-3 Period Pattern: Regular Menstrual Flow: Light Menstrual Control: Thin pad Menstrual Control Change Freq (Hours): 4 Dysmenorrhea: None Contraception/Family planning: IUD Sexually active: Yes  Health Maintenance Last Pap: 05/14/2022. Results were: Normal neg HPV, 5-year repeat Last mammogram: Not indicated Last colonoscopy: Not indicated Last Dexa: Not indicated     07/05/2024    2:40 PM  Depression screen PHQ 2/9  Decreased Interest 0  Down, Depressed, Hopeless 1  PHQ - 2 Score 1     Past medical history, past surgical history, family history and social history were all reviewed and documented in the EPIC chart. Married. Works for [employer]. 32 yo daughter, Leslie Mccoy.   ROS:  A ROS was performed and pertinent positives and negatives are included.  Exam:  Vitals:   07/05/24 1434  BP: 112/78  Pulse: 71  SpO2: 98%  Weight: 172 lb (78 kg)  Height: 5' 5 (1.651 m)     Body mass index is 28.62 kg/m.  General appearance:  Normal Thyroid :  Symmetrical, normal in size, without palpable masses or nodularity. Respiratory  Auscultation:  Clear without wheezing or rhonchi Cardiovascular  Auscultation:  Irregularly regular rhythm (sounds like trigeminy)  Edema/varicosities:  Not grossly evident Abdominal  Soft,nontender, without masses, guarding or rebound.  Liver/spleen:  No organomegaly noted  Hernia:  None appreciated  Skin  Inspection:  Grossly normal Breasts: Examined lying and sitting.   Right: Without masses, retractions, nipple discharge or axillary adenopathy.   Left: Without masses, retractions, nipple discharge or axillary  adenopathy. Pelvic: External genitalia:  no lesions              Urethra:  normal appearing urethra with no masses, tenderness or lesions              Bartholins and Skenes: normal                 Vagina: normal appearing vagina with normal color and discharge, no lesions              Cervix: no lesions. + IUD strings Bimanual Exam:  Uterus:  no masses or tenderness              Adnexa: no mass, fullness, tenderness              Rectovaginal: Deferred              Anus:  normal, no lesions  Dereck Keas, CMA present as chaperone.   Assessment/Plan:  33 y.o. G1P1001 for annual exam.   Well female exam with routine gynecological exam - Education provided on SBEs, importance of preventative screenings, current guidelines, high calcium diet, regular exercise, and multivitamin daily.  Labs done by PCP and rheumatology.   Cardiac arrhythmia, unspecified cardiac arrhythmia type - Irregularly regular heart rhythm, occurs every 3rd beat. Asymptomatic. Did drink red bull this morning. Will follow up with PCP. Discussed possible symptoms to be aware of.   Encounter for routine checking of intrauterine contraceptive device (IUD) - Mirena  IUD 03/2021. Light monthly cycles. Aware of 8-year FDA approval.   Screening for cervical cancer - Normal Pap history. Will repeat at 5-year interval per guidelines.   Return in about 1 year (around  07/05/2025) for Annual.     Leslie DELENA Shutter DNP, 3:03 PM 07/05/2024

## 2024-07-06 DIAGNOSIS — I493 Ventricular premature depolarization: Secondary | ICD-10-CM | POA: Diagnosis not present

## 2024-07-06 DIAGNOSIS — Z1152 Encounter for screening for COVID-19: Secondary | ICD-10-CM | POA: Diagnosis not present

## 2024-07-06 DIAGNOSIS — J302 Other seasonal allergic rhinitis: Secondary | ICD-10-CM | POA: Diagnosis not present

## 2024-07-06 DIAGNOSIS — D84821 Immunodeficiency due to drugs: Secondary | ICD-10-CM | POA: Diagnosis not present

## 2024-07-06 DIAGNOSIS — F330 Major depressive disorder, recurrent, mild: Secondary | ICD-10-CM | POA: Diagnosis not present

## 2024-07-06 DIAGNOSIS — M3210 Systemic lupus erythematosus, organ or system involvement unspecified: Secondary | ICD-10-CM | POA: Diagnosis not present

## 2024-07-06 DIAGNOSIS — R0609 Other forms of dyspnea: Secondary | ICD-10-CM | POA: Diagnosis not present

## 2024-07-13 ENCOUNTER — Ambulatory Visit: Attending: Nurse Practitioner

## 2024-07-13 ENCOUNTER — Other Ambulatory Visit: Payer: Self-pay | Admitting: *Deleted

## 2024-07-13 DIAGNOSIS — I493 Ventricular premature depolarization: Secondary | ICD-10-CM

## 2024-07-13 NOTE — Progress Notes (Unsigned)
 Enrolled for [employer] to mail a ZIO XT long term holter monitor to the patients address on file.   EP to read

## 2024-07-21 DIAGNOSIS — D7289 Other specified disorders of white blood cells: Secondary | ICD-10-CM | POA: Diagnosis not present

## 2024-07-21 DIAGNOSIS — M329 Systemic lupus erythematosus, unspecified: Secondary | ICD-10-CM | POA: Diagnosis not present

## 2024-07-21 DIAGNOSIS — Z1331 Encounter for screening for depression: Secondary | ICD-10-CM | POA: Diagnosis not present

## 2024-07-26 DIAGNOSIS — F4323 Adjustment disorder with mixed anxiety and depressed mood: Secondary | ICD-10-CM | POA: Diagnosis not present

## 2024-07-28 DIAGNOSIS — I493 Ventricular premature depolarization: Secondary | ICD-10-CM | POA: Diagnosis not present

## 2024-07-29 DIAGNOSIS — I493 Ventricular premature depolarization: Secondary | ICD-10-CM

## 2024-08-02 DIAGNOSIS — F4323 Adjustment disorder with mixed anxiety and depressed mood: Secondary | ICD-10-CM | POA: Diagnosis not present

## 2024-08-18 ENCOUNTER — Ambulatory Visit: Admitting: Nurse Practitioner

## 2024-08-18 ENCOUNTER — Encounter: Payer: Self-pay | Admitting: Nurse Practitioner

## 2024-08-18 VITALS — BP 114/68 | HR 77 | Temp 98.1°F

## 2024-08-18 DIAGNOSIS — N3001 Acute cystitis with hematuria: Secondary | ICD-10-CM | POA: Diagnosis not present

## 2024-08-18 DIAGNOSIS — R309 Painful micturition, unspecified: Secondary | ICD-10-CM

## 2024-08-18 MED ORDER — SULFAMETHOXAZOLE-TRIMETHOPRIM 800-160 MG PO TABS: 1.0000 | ORAL_TABLET | Freq: Two times a day (BID) | ORAL | 0 refills | Status: AC

## 2024-08-18 NOTE — Progress Notes (Signed)
   Acute Office Visit  Subjective:    Patient ID: Leslie Mccoy, female    DOB: Jun 30, 1991, 33 y.o.   MRN: [id number]   HPI 33 y.o. presents today for urinary frequency and dysuria. Treated for UTI 9/17 elsewhere virtually with Macrobid . Symptoms did not resolve and she was out of town, so she was provided another dose of Macrobid  around 10/2. Taking AZO.   Patient's last menstrual period was 07/28/2024 (exact date). Period Duration (Days): 2 Period Pattern: Regular Menstrual Flow: Light Menstrual Control: Maxi pad Dysmenorrhea: None  Review of Systems  Constitutional: Negative.   Genitourinary:  Positive for dysuria, frequency and urgency. Negative for flank pain and hematuria.       Objective:    Physical Exam Constitutional:      Appearance: Normal appearance.     BP 114/68   Pulse 77   Temp 98.1 F (36.7 C) (Oral)   LMP 07/28/2024 (Exact Date)   SpO2 96%  Wt Readings from Last 3 Encounters:  07/05/24 172 lb (78 kg)  01/14/24 174 lb (78.9 kg)  05/19/23 172 lb (78 kg)        UA: trace leukocytes, + nitrites, 2+ blood, trace protein, orange/clear. Microscopic: wbc 10-20, rbc 40-60, few bacteria  Assessment & Plan:   Problem List Items Addressed This Visit   None Visit Diagnoses       Acute cystitis with hematuria    -  Primary   Relevant Medications   sulfamethoxazole -trimethoprim  (BACTRIM  DS) 800-160 MG tablet     Pain with urination       Relevant Orders   Urinalysis,Complete w/RFL Culture (Completed)      Plan: Acute cystitis - Bactrim  800-160 mg BID x 3 days. Culture pending. Increase water intake, continue AZO as needed.   Return if symptoms worsen or fail to improve.    [name] DNP, 12:37 PM 08/18/2024

## 2024-08-20 ENCOUNTER — Other Ambulatory Visit: Payer: Self-pay | Admitting: Nurse Practitioner

## 2024-08-20 ENCOUNTER — Telehealth: Payer: Self-pay

## 2024-08-20 ENCOUNTER — Ambulatory Visit: Payer: Self-pay | Admitting: Nurse Practitioner

## 2024-08-20 DIAGNOSIS — N760 Acute vaginitis: Secondary | ICD-10-CM

## 2024-08-20 LAB — URINALYSIS, COMPLETE W/RFL CULTURE
Bilirubin Urine: NEGATIVE
Casts: NONE SEEN /LPF
Crystals: NONE SEEN /HPF
Ketones, ur: NEGATIVE
Nitrites, Initial: POSITIVE — AB
Specific Gravity, Urine: 1.01 (ref 1.001–1.035)
Yeast: NONE SEEN /HPF
pH: 5.5 (ref 5.0–8.0)

## 2024-08-20 LAB — URINE CULTURE
MICRO NUMBER:: 17132992
Result:: NO GROWTH
SPECIMEN QUALITY:: ADEQUATE

## 2024-08-20 LAB — CULTURE INDICATED

## 2024-08-20 MED ORDER — FLUCONAZOLE 150 MG PO TABS: 150.0000 mg | ORAL_TABLET | ORAL | 0 refills | Status: DC

## 2024-08-20 NOTE — Telephone Encounter (Signed)
 Patient notified that diflucan  was sent to the pharmacy & to schedule an appt to be seen if not better.

## 2024-08-20 NOTE — Telephone Encounter (Signed)
-----   Message from [name] sent at 08/20/2024  8:30 AM EDT ----- Regarding: Follow up I went ahead and sent her in Diflucan  in case her symptoms are from a yeast infection from being on the long course of antibiotics. If her symptoms continue into next week she should schedule an appt.

## 2024-08-22 NOTE — Progress Notes (Unsigned)
 Cardiology Office Note   Date:  08/25/2024   ID:  Leslie Mccoy, DOB 1990-12-28, MRN [id number]  PCP:  Larnell Hamilton, MD  Cardiologist:   None Referring:  Larnell Hamilton, MD  No chief complaint on file.     History of Present Illness: Leslie Mccoy is a 33 y.o. female who presents for evaluation of PVCs.   She was referred by Larnell Hamilton, MD .  She has no past cardiac history.  She was noted to have PVCs recently.  She wore a monitor which demonstrated 4.3% PVCs.  She had a couple of couplets and some ventricular bigeminy and trigeminy.  She really did not notice these.  She had been under a lot of stress at work with some lifting and moving.  She has a 67-year-old.  She was drinking 1 caffeinated drink a day.  She was not having any chest discomfort, neck or arm discomfort.  She was not having any presyncope or syncope.  She denies any shortness of breath, PND or orthopnea.  She has had no prior cardiac workup.  I do see labs to include TSH but not recent electrolytes.  She does have lupus which has been mostly affecting her joints.  She has not had any prior cardiac workup.   Past Medical History:  Diagnosis Date   Depression    Immunodeficiency due to drugs    Lupus 01/2016   PVC (premature ventricular contraction)    Seasonal allergies     Past Surgical History:  Procedure Laterality Date   INTRAUTERINE DEVICE INSERTION  05/2016   Mirena    Moles excised       Current Outpatient Medications  Medication Sig Dispense Refill   azaTHIOprine (IMURAN) 50 MG tablet Take by mouth.     BIOTIN PO Take by mouth.     buPROPion (WELLBUTRIN XL) 300 MG 24 hr tablet Take 300 mg by mouth daily.     Cholecalciferol (VITAMIN D-1000 MAX ST) 25 MCG (1000 UT) tablet Take by mouth.     ferrous sulfate 325 (65 FE) MG tablet Take by mouth.     hydroxychloroquine (PLAQUENIL) 200 MG tablet Take 200 mg by mouth daily.     levocetirizine (XYZAL) 5 MG tablet 1 tablet in the evening  Orally Once a day     Levonorgestrel  (MIRENA , 52 MG, IU) Intrauterine     Magnesium 250 MG TABS 1 tablet with a meal Orally Once a day     No current facility-administered medications for this visit.    Allergies:   Patient has no known allergies.    Social History:  The patient  reports that she has never smoked. She has never used smokeless tobacco. She reports that she does not currently use alcohol. She reports that she does not use drugs.   Family History:  The patient's family history includes Alcoholism in her mother; Bipolar disorder in her sister; Breast cancer (age of onset: 96) in her maternal aunt; Depression in her mother.  She does not know her father's history   ROS:  Please see the history of present illness.   Otherwise, review of systems are positive for none.   All other systems are reviewed and negative.    PHYSICAL EXAM: VS:  Pulse (!) 56   Ht 5' 5 (1.651 m)   Wt 174 lb (78.9 kg)   LMP 07/28/2024 (Exact Date)   BMI 28.96 kg/m  , BMI Body mass index is 28.96 kg/m. GENERAL:  Well appearing HEENT:  Pupils equal round and reactive, fundi not visualized, oral mucosa unremarkable NECK:  No jugular venous distention, waveform within normal limits, carotid upstroke brisk and symmetric, no bruits, no thyromegaly LYMPHATICS:  No cervical, inguinal adenopathy LUNGS:  Clear to auscultation bilaterally BACK:  No CVA tenderness CHEST:  Unremarkable HEART:  PMI not displaced or sustained,S1 and S2 within normal limits, no S3, no S4, no clicks, no rubs, no murmurs ABD:  Flat, positive bowel sounds normal in frequency in pitch, no bruits, no rebound, no guarding, no midline pulsatile mass, no hepatomegaly, no splenomegaly EXT:  2 plus pulses throughout, no edema, no cyanosis no clubbing SKIN:  No rashes no nodules NEURO:  Cranial nerves II through XII grossly intact, motor grossly intact throughout PSYCH:  Cognitively intact, oriented to person place and time    EKG:   EKG Interpretation Date/Time:  Wednesday August 25 2024 08:06:59 EDT Ventricular Rate:  56 PR Interval:  190 QRS Duration:  82 QT Interval:  414 QTC Calculation: 399 R Axis:   93  Text Interpretation: Sinus bradycardia Rightward axis When compared with ECG of 30-Sep-2016 13:33, No significant change was found Confirmed by Lavona Agent ([id number]) on 08/25/2024 8:21:13 AM     Recent Labs: No results found for requested labs within last 365 days.    Lipid Panel No results found for: CHOL, TRIG, HDL, CHOLHDL, VLDL, LDLCALC, LDLDIRECT    Wt Readings from Last 3 Encounters:  08/25/24 174 lb (78.9 kg)  07/05/24 172 lb (78 kg)  01/14/24 174 lb (78.9 kg)      Other studies Reviewed: Additional studies/ records that were reviewed today include: Labs. Primary care records Review of the above records demonstrates:  Please see elsewhere in the note.     ASSESSMENT AND PLAN:   PVCs: The patient has frequent ventricular ectopy.  I will check an echocardiogram.  I will check a basic metabolic profile and magnesium.  However, she is not particularly symptomatic and at this point I am not thinking that she will need further management.  We talked about avoiding caffeine and that bothers her.  She would let me know if they become more symptomatic.  Otherwise no other cardiovascular testing.  Current medicines are reviewed at length with the patient today.  The patient does not have concerns regarding medicines.  The following changes have been made:  no change  Labs/ tests ordered today include:   Orders Placed This Encounter  Procedures   Basic metabolic panel with GFR   Magnesium   EKG 12-Lead   ECHOCARDIOGRAM COMPLETE     Disposition:   FU with me as needed.     Signed, Agent Lavona, MD  08/25/2024 8:45 AM    [facility]

## 2024-08-25 ENCOUNTER — Encounter: Payer: Self-pay | Admitting: Cardiology

## 2024-08-25 ENCOUNTER — Ambulatory Visit: Attending: Cardiology | Admitting: Cardiology

## 2024-08-25 VITALS — HR 56 | Ht 65.0 in | Wt 174.0 lb

## 2024-08-25 DIAGNOSIS — I493 Ventricular premature depolarization: Secondary | ICD-10-CM | POA: Diagnosis not present

## 2024-08-25 NOTE — Patient Instructions (Signed)
 Medication Instructions:  Your physician recommends that you continue on your current medications as directed. Please refer to the Current Medication list given to you today.  *If you need a refill on your cardiac medications before your next appointment, please call your pharmacy*  Lab Work: BMET, Magnesium today at [facility] If you have labs (blood work) drawn today and your tests are completely normal, you will receive your results only by: MyChart Message (if you have MyChart) OR A paper copy in the mail If you have any lab test that is abnormal or we need to change your treatment, we will call you to review the results.  Testing/Procedures: Echocardiogram Your physician has requested that you have an echocardiogram. Echocardiography is a painless test that uses sound waves to create images of your heart. It provides your doctor with information about the size and shape of your heart and how well your heart's chambers and valves are working. This procedure takes approximately one hour. There are no restrictions for this procedure. Please do NOT wear cologne, perfume, aftershave, or lotions (deodorant is allowed). Please arrive 15 minutes prior to your appointment time.  Please note: We ask at that you not bring children with you during ultrasound (echo/ vascular) testing. Due to room size and safety concerns, children are not allowed in the ultrasound rooms during exams. Our front office staff cannot provide observation of children in our lobby area while testing is being conducted. An adult accompanying a patient to their appointment will only be allowed in the ultrasound room at the discretion of the ultrasound technician under special circumstances. We apologize for any inconvenience.   Follow-Up: At [facility], you and your health needs are our priority.  As part of our continuing mission to provide you with exceptional heart care, our providers are all part of one team.  This  team includes your primary Cardiologist (physician) and Advanced Practice Providers or APPs (Physician Assistants and Nurse Practitioners) who all work together to provide you with the care you need, when you need it.  Your next appointment:   As needed  Provider:   Lavona, MD  We recommend signing up for the patient portal called MyChart.  Sign up information is provided on this After Visit Summary.  MyChart is used to connect with patients for Virtual Visits (Telemedicine).  Patients are able to view lab/test results, encounter notes, upcoming appointments, etc.  Non-urgent messages can be sent to your provider as well.   To learn more about what you can do with MyChart, go to [email or web address].

## 2024-08-26 DIAGNOSIS — F4323 Adjustment disorder with mixed anxiety and depressed mood: Secondary | ICD-10-CM | POA: Diagnosis not present

## 2024-08-26 LAB — BASIC METABOLIC PANEL WITH GFR
BUN/Creatinine Ratio: 11 (ref 9–23)
BUN: 10 mg/dL (ref 6–20)
CO2: 20 mmol/L (ref 20–29)
Calcium: 9.4 mg/dL (ref 8.7–10.2)
Chloride: 103 mmol/L (ref 96–106)
Creatinine, Ser: 0.9 mg/dL (ref 0.57–1.00)
Glucose: 82 mg/dL (ref 70–99)
Potassium: 4.4 mmol/L (ref 3.5–5.2)
Sodium: 139 mmol/L (ref 134–144)
eGFR: 87 mL/min/1.73 (ref >59)

## 2024-08-26 LAB — MAGNESIUM: Magnesium: 2.1 mg/dL (ref 1.6–2.3)

## 2024-08-29 ENCOUNTER — Ambulatory Visit: Payer: Self-pay | Admitting: Cardiology

## 2024-09-01 DIAGNOSIS — D225 Melanocytic nevi of trunk: Secondary | ICD-10-CM | POA: Diagnosis not present

## 2024-09-01 DIAGNOSIS — D2262 Melanocytic nevi of left upper limb, including shoulder: Secondary | ICD-10-CM | POA: Diagnosis not present

## 2024-09-01 DIAGNOSIS — R7989 Other specified abnormal findings of blood chemistry: Secondary | ICD-10-CM | POA: Diagnosis not present

## 2024-09-01 DIAGNOSIS — Q833 Accessory nipple: Secondary | ICD-10-CM | POA: Diagnosis not present

## 2024-09-01 DIAGNOSIS — D2261 Melanocytic nevi of right upper limb, including shoulder: Secondary | ICD-10-CM | POA: Diagnosis not present

## 2024-09-01 DIAGNOSIS — D2272 Melanocytic nevi of left lower limb, including hip: Secondary | ICD-10-CM | POA: Diagnosis not present

## 2024-09-01 DIAGNOSIS — D485 Neoplasm of uncertain behavior of skin: Secondary | ICD-10-CM | POA: Diagnosis not present

## 2024-09-01 DIAGNOSIS — L858 Other specified epidermal thickening: Secondary | ICD-10-CM | POA: Diagnosis not present

## 2024-09-01 DIAGNOSIS — D84821 Immunodeficiency due to drugs: Secondary | ICD-10-CM | POA: Diagnosis not present

## 2024-09-01 DIAGNOSIS — L821 Other seborrheic keratosis: Secondary | ICD-10-CM | POA: Diagnosis not present

## 2024-09-01 DIAGNOSIS — L718 Other rosacea: Secondary | ICD-10-CM | POA: Diagnosis not present

## 2024-09-01 DIAGNOSIS — D2239 Melanocytic nevi of other parts of face: Secondary | ICD-10-CM | POA: Diagnosis not present

## 2024-09-01 DIAGNOSIS — Z79899 Other long term (current) drug therapy: Secondary | ICD-10-CM | POA: Diagnosis not present

## 2024-09-01 DIAGNOSIS — D2271 Melanocytic nevi of right lower limb, including hip: Secondary | ICD-10-CM | POA: Diagnosis not present

## 2024-09-01 DIAGNOSIS — D224 Melanocytic nevi of scalp and neck: Secondary | ICD-10-CM | POA: Diagnosis not present

## 2024-09-07 DIAGNOSIS — F4323 Adjustment disorder with mixed anxiety and depressed mood: Secondary | ICD-10-CM | POA: Diagnosis not present

## 2024-09-10 ENCOUNTER — Ambulatory Visit (HOSPITAL_COMMUNITY)
Admission: RE | Admit: 2024-09-10 | Discharge: 2024-09-10 | Disposition: A | Discharge status: Home / Self Care | Source: Ambulatory Visit | Attending: Cardiology | Admitting: Cardiology

## 2024-09-10 DIAGNOSIS — I493 Ventricular premature depolarization: Secondary | ICD-10-CM

## 2024-09-10 LAB — ECHOCARDIOGRAM COMPLETE
Area-P 1/2: 3.79 cm2
S' Lateral: 3 cm

## 2024-09-15 DIAGNOSIS — L988 Other specified disorders of the skin and subcutaneous tissue: Secondary | ICD-10-CM | POA: Diagnosis not present

## 2024-09-15 DIAGNOSIS — D485 Neoplasm of uncertain behavior of skin: Secondary | ICD-10-CM | POA: Diagnosis not present

## 2024-09-20 DIAGNOSIS — F4323 Adjustment disorder with mixed anxiety and depressed mood: Secondary | ICD-10-CM | POA: Diagnosis not present

## 2024-09-22 NOTE — Progress Notes (Signed)
The patient has been notified of the result and verbalized understanding.  All questions (if any) were answered.     

## 2024-10-06 DIAGNOSIS — F4323 Adjustment disorder with mixed anxiety and depressed mood: Secondary | ICD-10-CM | POA: Diagnosis not present

## 2024-10-18 DIAGNOSIS — F4323 Adjustment disorder with mixed anxiety and depressed mood: Secondary | ICD-10-CM | POA: Diagnosis not present

## 2025-07-06 ENCOUNTER — Ambulatory Visit: Admitting: Nurse Practitioner
